# Patient Record
Sex: Male | Born: 1955 | Race: White | Hispanic: No | Marital: Married | Smoking: Former smoker
Health system: Southern US, Community
[De-identification: ages and names within clinical notes are randomized; demographics above are authoritative.]

## PROBLEM LIST (undated history)

## (undated) DIAGNOSIS — I10 Essential (primary) hypertension: Secondary | ICD-10-CM

## (undated) DIAGNOSIS — G4730 Sleep apnea, unspecified: Secondary | ICD-10-CM

## (undated) DIAGNOSIS — E119 Type 2 diabetes mellitus without complications: Secondary | ICD-10-CM

## (undated) DIAGNOSIS — E785 Hyperlipidemia, unspecified: Secondary | ICD-10-CM

---

## 2012-11-16 ENCOUNTER — Emergency Department
Admission: EM | Admit: 2012-11-16 | Discharge: 2012-11-16 | Disposition: A | Discharge status: Home / Self Care | Payer: BC Managed Care – PPO | Source: Home / Self Care

## 2012-11-16 DIAGNOSIS — G4730 Sleep apnea, unspecified: Secondary | ICD-10-CM | POA: Insufficient documentation

## 2012-11-16 DIAGNOSIS — I10 Essential (primary) hypertension: Secondary | ICD-10-CM | POA: Insufficient documentation

## 2012-11-16 DIAGNOSIS — E785 Hyperlipidemia, unspecified: Secondary | ICD-10-CM | POA: Insufficient documentation

## 2012-11-16 DIAGNOSIS — E119 Type 2 diabetes mellitus without complications: Secondary | ICD-10-CM | POA: Insufficient documentation

## 2012-11-16 DIAGNOSIS — J069 Acute upper respiratory infection, unspecified: Secondary | ICD-10-CM

## 2012-11-16 HISTORY — DX: Type 2 diabetes mellitus without complications: E11.9

## 2012-11-16 HISTORY — DX: Sleep apnea, unspecified: G47.30

## 2012-11-16 HISTORY — DX: Essential (primary) hypertension: I10

## 2012-11-16 HISTORY — DX: Hyperlipidemia, unspecified: E78.5

## 2012-11-16 MED ORDER — PREDNISONE 20 MG PO TABS: 20.0000 mg | ORAL_TABLET | Freq: Two times a day (BID) | ORAL | Status: DC

## 2012-11-16 MED ORDER — BENZONATATE 100 MG PO CAPS: ORAL_CAPSULE | ORAL | Status: DC

## 2012-11-16 MED ORDER — AZITHROMYCIN 250 MG PO TABS: ORAL_TABLET | ORAL | Status: DC

## 2012-11-16 NOTE — ED Notes (Signed)
Patient complains of a dry cough for 2 days. He also reports chills, sweats, congestion, shortness of breath and chest pain.

## 2012-11-16 NOTE — ED Provider Notes (Signed)
History    CSN: [id number] Arrival date & time 11/16/12  1414  None    Chief Complaint  Patient presents with  . Cough    x 2 days  . Generalized Body Aches    x 2 days      HPI Comments: Patient complains of a dry cough for 2 days. He also reports chills, sweats, congestion, shortness of breath and chest pain.  The history is provided by the patient and the spouse.   Past Medical History  Diagnosis Date  . Hyperlipidemia   . Hypertension   . Diabetes mellitus without complication   . Sleep apnea    History reviewed. No pertinent past surgical history. Family History  Problem Relation Age of Onset  . Hypertension Mother   . Hypertension Father    History  Substance Use Topics  . Smoking status: Former Smoker -- 1.00 packs/day for 25 years    Types: Cigarettes  . Smokeless tobacco: Not on file  . Alcohol Use: No    Review of Systems + sore throat + cough No pleuritic pain No wheezing + nasal congestion + post-nasal drainage + sinus pain/pressure No itchy/red eyes No earache No hemoptysis + SOB No fever, + chills No nausea No vomiting No abdominal pain No diarrhea No urinary symptoms No skin rashes + fatigue + myalgias No headache Used OTC meds without relief  Allergies  Erythromycin and Sulfa antibiotics  Home Medications   Current Outpatient Rx  Name  Route  Sig  Dispense  Refill  . atenolol (TENORMIN) 25 MG tablet   Oral   Take 50 mg by mouth daily.         Marland Kitchen atorvastatin (LIPITOR) 40 MG tablet   Oral   Take 40 mg by mouth daily.         . cetirizine (ZYRTEC) 10 MG tablet   Oral   Take 10 mg by mouth daily.         . insulin glargine (LANTUS) 100 UNIT/ML injection   Subcutaneous   Inject 120 Units into the skin at bedtime.         . metFORMIN (GLUCOPHAGE) 1000 MG tablet   Oral   Take 1,000 mg by mouth 2 (two) times daily with a meal.         . omeprazole (PRILOSEC) 20 MG capsule   Oral   Take 20 mg by mouth  daily.         . ranitidine (ZANTAC) 150 MG tablet   Oral   Take 150 mg by mouth 2 (two) times daily.         Marland Kitchen azithromycin (ZITHROMAX Z-PAK) 250 MG tablet      Take 2 tabs today; then begin one tab once daily for 4 more days. (Rx void after 11/24/12)   6 each   0   . benzonatate (TESSALON) 100 MG capsule      Take one cap at bedtime as necessary for cough   12 capsule   0   . predniSONE (DELTASONE) 20 MG tablet   Oral   Take 1 tablet (20 mg total) by mouth 2 (two) times daily.   10 tablet   0    BP 156/90  Pulse 89  Temp(Src) 98.2 F (36.8 C) (Oral)  Ht 5\' 11"  (1.803 m)  Wt 247 lb (112.038 kg)  BMI 34.46 kg/m2  SpO2 96% Physical Exam Nursing notes and Vital Signs reviewed. Appearance:  Patient appears stated age, and  in no acute distress.  Patient is obese (BMI 34.5) Eyes:  Pupils are equal, round, and reactive to light and accomodation.  Extraocular movement is intact.  Conjunctivae are not inflamed  Ears:  Canals normal.  Tympanic membranes normal.  Nose:  Mildly congested turbinates.  No sinus tenderness.   Pharynx:  Normal Neck:  Supple.  Tender shotty posterior nodes are palpated bilaterally  Lungs:  Clear to auscultation.  Breath sounds are equal.  Chest:  Distinct tenderness to palpation over the mid-sternum.  Heart:  Regular rate and rhythm without murmurs, rubs, or gallops.  Abdomen:  Nontender without masses or hepatosplenomegaly.  Bowel sounds are present.  No CVA or flank tenderness.  Extremities:  No edema.  No calf tenderness Skin:  No rash present.   ED Course  Procedures  none   1. Acute upper respiratory infections of unspecified site     MDM  There is no evidence of bacterial infection today.   Begin prednisone burst.  Monitor glucose.  Prescription written for Benzonatate [facility]) to take at bedtime for night-time cough.  Take plain Mucinex (guaifenesin) twice daily for cough and congestion.  Increase fluid intake, rest. May use  Afrin nasal spray (or generic oxymetazoline) twice daily for about 5 days.  Also recommend using saline nasal spray several times daily and saline nasal irrigation (AYR is a common brand).  May continue Flonase nasal spray Stop all antihistamines for now, and other non-prescription cough/cold preparations. May take Ibuprofen 200mg , 4 tabs every 8 hours with food for chest/sternum discomfort. Begin Azithromycin if not improving about one week or if persistent fever develops (Given a prescription to hold, with an expiration date)  Follow-up with family doctor if not improving about10 days.   Lattie Haw, MD 11/16/12 [phone]

## 2013-05-10 ENCOUNTER — Emergency Department
Admission: EM | Admit: 2013-05-10 | Discharge: 2013-05-10 | Disposition: A | Discharge status: Home / Self Care | Payer: BC Managed Care – PPO | Source: Home / Self Care | Attending: Emergency Medicine | Admitting: Emergency Medicine

## 2013-05-10 ENCOUNTER — Encounter: Payer: Self-pay | Admitting: Emergency Medicine

## 2013-05-10 DIAGNOSIS — Z23 Encounter for immunization: Secondary | ICD-10-CM

## 2013-05-10 DIAGNOSIS — IMO0002 Reserved for concepts with insufficient information to code with codable children: Secondary | ICD-10-CM

## 2013-05-10 DIAGNOSIS — S0093XA Contusion of unspecified part of head, initial encounter: Secondary | ICD-10-CM

## 2013-05-10 DIAGNOSIS — S0091XA Abrasion of unspecified part of head, initial encounter: Secondary | ICD-10-CM

## 2013-05-10 DIAGNOSIS — S1093XA Contusion of unspecified part of neck, initial encounter: Secondary | ICD-10-CM

## 2013-05-10 DIAGNOSIS — S0083XA Contusion of other part of head, initial encounter: Secondary | ICD-10-CM

## 2013-05-10 DIAGNOSIS — S0003XA Contusion of scalp, initial encounter: Secondary | ICD-10-CM

## 2013-05-10 MED ORDER — TETANUS-DIPHTH-ACELL PERTUSSIS 5-2.5-18.5 LF-MCG/0.5 IM SUSP
0.5000 mL | Freq: Once | INTRAMUSCULAR | Status: AC
  Administered 2013-05-10: 0.5 mL via INTRAMUSCULAR

## 2013-05-10 MED ORDER — CEPHALEXIN 500 MG PO CAPS: 500.0000 mg | ORAL_CAPSULE | Freq: Three times a day (TID) | ORAL | Status: DC

## 2013-05-10 NOTE — ED Notes (Signed)
Abrasion on top right scalp, hit with the end of a drop cord.

## 2013-05-10 NOTE — ED Provider Notes (Signed)
CSN: [id number]     Arrival date & time 05/10/13  1001 History   First MD Initiated Contact with Patient 05/10/13 1008     Chief Complaint  Patient presents with  . Abrasion   (Consider location/radiation/quality/duration/timing/severity/associated sxs/prior Treatment) Patient is a 58 y.o. male presenting with head injury. The history is provided by the patient.  Head Injury Location:  Frontal (Right frontal) Time since incident:  1 hour Mechanism of injury: direct blow   Mechanism of injury comment:  Accidentally hit the end of the drop cord Pain details:    Quality:  Dull   Radiates to: No radiation.   Severity:  Mild   Timing:  Unable to specify   Progression:  Unchanged Chronicity:  New Relieved by:  None tried Associated symptoms: no blurred vision, no difficulty breathing, no disorientation, no double vision, no focal weakness, no headaches, no hearing loss, no loss of consciousness, no memory loss, no nausea, no neck pain, no numbness, no seizures, no tinnitus and no vomiting   Risk factors comment:  Past medical history of diabetes, controlled. Denies hypoglycemia symptoms   Past Medical History  Diagnosis Date  . Hyperlipidemia   . Hypertension   . Diabetes mellitus without complication   . Sleep apnea    History reviewed. No pertinent past surgical history. Family History  Problem Relation Age of Onset  . Hypertension Mother   . Hypertension Father    History  Substance Use Topics  . Smoking status: Former Smoker -- 1.00 packs/day for 25 years    Types: Cigarettes  . Smokeless tobacco: Not on file  . Alcohol Use: No    Review of Systems  HENT: Negative for hearing loss and tinnitus.   Eyes: Negative for blurred vision and double vision.  Gastrointestinal: Negative for nausea and vomiting.  Musculoskeletal: Negative for neck pain.  Neurological: Negative for focal weakness, seizures, loss of consciousness, numbness and headaches.  Psychiatric/Behavioral:  Negative for memory loss.  All other systems reviewed and are negative.    Allergies  Erythromycin and Sulfa antibiotics  Home Medications   Current Outpatient Rx  Name  Route  Sig  Dispense  Refill  . atenolol (TENORMIN) 25 MG tablet   Oral   Take 50 mg by mouth daily.         Marland Kitchen. atorvastatin (LIPITOR) 40 MG tablet   Oral   Take 40 mg by mouth daily.         Marland Kitchen. azithromycin (ZITHROMAX Z-PAK) 250 MG tablet      Take 2 tabs today; then begin one tab once daily for 4 more days. (Rx void after 11/24/12)   6 each   0   . benzonatate (TESSALON) 100 MG capsule      Take one cap at bedtime as necessary for cough   12 capsule   0   . cephALEXin (KEFLEX) 500 MG capsule   Oral   Take 1 capsule (500 mg total) by mouth 3 (three) times daily. For 5 days (antibiotic)   15 capsule   0   . cetirizine (ZYRTEC) 10 MG tablet   Oral   Take 10 mg by mouth daily.         . insulin glargine (LANTUS) 100 UNIT/ML injection   Subcutaneous   Inject 120 Units into the skin at bedtime.         . metFORMIN (GLUCOPHAGE) 1000 MG tablet   Oral   Take 1,000 mg by mouth 2 (two) times  daily with a meal.         . omeprazole (PRILOSEC) 20 MG capsule   Oral   Take 20 mg by mouth daily.         . predniSONE (DELTASONE) 20 MG tablet   Oral   Take 1 tablet (20 mg total) by mouth 2 (two) times daily.   10 tablet   0   . ranitidine (ZANTAC) 150 MG tablet   Oral   Take 150 mg by mouth 2 (two) times daily.          BP 151/91  Pulse 76  Temp(Src) 97.9 F (36.6 C) (Oral)  Ht 5\' 11"  (1.803 m)  Wt 242 lb (109.77 kg)  BMI 33.77 kg/m2  SpO2 97% Physical Exam  Nursing note and vitals reviewed. Constitutional: He is oriented to person, place, and time. He appears well-developed and well-nourished. No distress.  HENT:  Head: Normocephalic. Head is without raccoon's eyes and without Battle's sign.  There is a abrasion/superficial laceration, x shaped , right frontal scalp. No  foreign body seen. No active bleeding. Minimally tender. No cranial deformity  Eyes: Conjunctivae and EOM are normal. Pupils are equal, round, and reactive to light. No scleral icterus.  Neck: Normal range of motion.  Cardiovascular: Normal rate and regular rhythm.   Pulmonary/Chest: Effort normal.  Abdominal: He exhibits no distension.  Musculoskeletal: Normal range of motion. He exhibits no edema and no tenderness.  Neurological: He is alert and oriented to person, place, and time. No cranial nerve deficit.  Skin: Skin is warm. No rash noted.  Psychiatric: He has a normal mood and affect.    ED Course  Procedures (including critical care time) Labs Review Labs Reviewed - No data to display Imaging Review No results found.  EKG Interpretation    Date/Time:    Ventricular Rate:    PR Interval:    QRS Duration:   QT Interval:    QTC Calculation:   R Axis:     Text Interpretation:              MDM   1. Abrasion of head, initial encounter   2. Head contusion, initial encounter    Cleansed with Hibiclens. No foreign body seen. The abrasion/laceration is very superficial.--In my opinion, there is no need for any repair , as the edges are well approximated and very superficial. Polysporin and nonstick dressing applied.--Wound care discussed. Red flags discussed As last tetanus shot was over 7 years ago, updated DTaP today. Cephalexin prescribed to help prevent wound infection , given the risk factors that it was a dirty wound and has diabetes. Precautions discussed. Red flags discussed. Questions invited and answered. Patient voiced understanding and agreement.     Lajean Manes, MD 05/10/13 [phone]

## 2013-07-24 ENCOUNTER — Encounter: Payer: Self-pay | Admitting: Emergency Medicine

## 2013-07-24 ENCOUNTER — Emergency Department (INDEPENDENT_AMBULATORY_CARE_PROVIDER_SITE_OTHER)
Admission: EM | Admit: 2013-07-24 | Discharge: 2013-07-24 | Disposition: A | Discharge status: Home / Self Care | Payer: BC Managed Care – PPO | Source: Home / Self Care | Attending: Family Medicine | Admitting: Family Medicine

## 2013-07-24 DIAGNOSIS — Z8709 Personal history of other diseases of the respiratory system: Secondary | ICD-10-CM

## 2013-07-24 DIAGNOSIS — J069 Acute upper respiratory infection, unspecified: Secondary | ICD-10-CM

## 2013-07-24 MED ORDER — BENZONATATE 200 MG PO CAPS: 200.0000 mg | ORAL_CAPSULE | Freq: Every day | ORAL | Status: DC

## 2013-07-24 MED ORDER — PREDNISONE 20 MG PO TABS: 20.0000 mg | ORAL_TABLET | Freq: Two times a day (BID) | ORAL | Status: DC

## 2013-07-24 MED ORDER — AMOXICILLIN 875 MG PO TABS: 875.0000 mg | ORAL_TABLET | Freq: Two times a day (BID) | ORAL | Status: DC

## 2013-07-24 NOTE — ED Notes (Signed)
Burch c/o dry cough and nasal congestion x 2-3 days without fever. Denies fever. Rec'd flu vac this season.

## 2013-07-24 NOTE — Discharge Instructions (Signed)
Take plain Mucinex (1200 mg guaifenesin) twice daily for cough and congestion.  Increase fluid intake, rest. May use Afrin nasal spray (or generic oxymetazoline) twice daily for about 5 days.  Also recommend using saline nasal spray several times daily and saline nasal irrigation (AYR is a common brand) Stop all antihistamines for now, and other non-prescription cough/cold preparations. Begin Amoxicillin if not improving about one week or if persistent fever develops   Follow-up with family doctor if not improving about10 days.

## 2013-07-24 NOTE — ED Provider Notes (Signed)
CSN: [id number]     Arrival date & time 07/24/13  0806 History   First MD Initiated Contact with Patient 07/24/13 [phone]     Chief Complaint  Patient presents with  . Cough  . Nasal Congestion      HPI Comments: Two days ago patient developed onset of fatigue, myalgias, non-productive cough, and sinus congestion.  The next day he developed a mild sore throat, now resolved.  His cough is worse at night. He has a history of seasonal allergies and mild asthma.  The history is provided by the patient.    Past Medical History  Diagnosis Date  . Hyperlipidemia   . Hypertension   . Diabetes mellitus without complication   . Sleep apnea    History reviewed. No pertinent past surgical history. Family History  Problem Relation Age of Onset  . Hypertension Mother   . Hypertension Father    History  Substance Use Topics  . Smoking status: Former Smoker -- 1.00 packs/day for 25 years    Types: Cigarettes    Quit date: 07/25/1990  . Smokeless tobacco: Never Used  . Alcohol Use: No    Review of Systems + sore throat + cough No pleuritic pain No wheezing + nasal congestion + post-nasal drainage No sinus pain/pressure No itchy/red eyes No earache No hemoptysis No SOB No fever/chills No nausea No vomiting No abdominal pain No diarrhea No urinary symptoms No skin rash + fatigue + myalgias No headache Used OTC meds without relief  Allergies  Erythromycin and Sulfa antibiotics  Home Medications   Current Outpatient Rx  Name  Route  Sig  Dispense  Refill  . amoxicillin (AMOXIL) 875 MG tablet   Oral   Take 1 tablet (875 mg total) by mouth 2 (two) times daily. (Rx void after 08/01/13)   20 tablet   0   . atenolol (TENORMIN) 25 MG tablet   Oral   Take 50 mg by mouth daily.         Marland Kitchen. atorvastatin (LIPITOR) 40 MG tablet   Oral   Take 40 mg by mouth daily.         . benzonatate (TESSALON) 200 MG capsule   Oral   Take 1 capsule (200 mg total) by mouth at bedtime.  Take as needed for cough   12 capsule   0   . cetirizine (ZYRTEC) 10 MG tablet   Oral   Take 10 mg by mouth daily.         . insulin glargine (LANTUS) 100 UNIT/ML injection   Subcutaneous   Inject 120 Units into the skin at bedtime.         . metFORMIN (GLUCOPHAGE) 1000 MG tablet   Oral   Take 1,000 mg by mouth 2 (two) times daily with a meal.         . omeprazole (PRILOSEC) 20 MG capsule   Oral   Take 20 mg by mouth daily.         . predniSONE (DELTASONE) 20 MG tablet   Oral   Take 1 tablet (20 mg total) by mouth 2 (two) times daily. Take with food.   10 tablet   0   . ranitidine (ZANTAC) 150 MG tablet   Oral   Take 150 mg by mouth 2 (two) times daily.          BP 118/77  Pulse 97  Temp(Src) 98.4 F (36.9 C) (Oral)  Resp 14  Wt 231 lb (104.781 kg)  SpO2 97% Physical Exam Nursing notes and Vital Signs reviewed. Appearance:  Patient appears healthy, stated age, and in no acute distress Eyes:  Pupils are equal, round, and reactive to light and accomodation.  Extraocular movement is intact.  Conjunctivae are not inflamed  Ears:  Canals normal.  Tympanic membranes normal.  Nose:  Mildly congested turbinates.  No sinus tenderness.  Pharynx:  Normal Neck:  Supple.  Tender enlarged posterior nodes are palpated bilaterally  Lungs:  Clear to auscultation.  Breath sounds are equal.  Heart:  Regular rate and rhythm without murmurs, rubs, or gallops.  Abdomen:  Nontender without masses or hepatosplenomegaly.  Bowel sounds are present.  No CVA or flank tenderness.  Extremities:  No edema.  No calf tenderness Skin:  No rash present.    ED Course  Procedures  none      MDM   1. Acute upper respiratory infections of unspecified site; suspect viral URI History of asthma    There is no evidence of bacterial infection today.   Begin prednisone burst.  Prescription written for Benzonatate (Tessalon) to take at bedtime for night-time cough.  Take plain Mucinex  (1200 mg guaifenesin) twice daily for cough and congestion.  Increase fluid intake, rest. May use Afrin nasal spray (or generic oxymetazoline) twice daily for about 5 days.  Also recommend using saline nasal spray several times daily and saline nasal irrigation (AYR is a common brand) Stop all antihistamines for now, and other non-prescription cough/cold preparations. Begin Amoxicillin if not improving about one week or if persistent fever develops (Given a prescription to hold, with an expiration date)  Follow-up with family doctor if not improving about10 days.     Lattie Haw, MD 07/24/13 [phone]

## 2014-02-01 ENCOUNTER — Encounter: Payer: Self-pay | Admitting: Emergency Medicine

## 2014-02-01 ENCOUNTER — Emergency Department (INDEPENDENT_AMBULATORY_CARE_PROVIDER_SITE_OTHER)
Admission: EM | Admit: 2014-02-01 | Discharge: 2014-02-01 | Disposition: A | Discharge status: Home / Self Care | Payer: BC Managed Care – PPO | Source: Home / Self Care | Attending: Family Medicine | Admitting: Family Medicine

## 2014-02-01 DIAGNOSIS — T148XXA Other injury of unspecified body region, initial encounter: Secondary | ICD-10-CM

## 2014-02-01 DIAGNOSIS — W540XXA Bitten by dog, initial encounter: Secondary | ICD-10-CM

## 2014-02-01 MED ORDER — AMOXICILLIN-POT CLAVULANATE 875-125 MG PO TABS: 1.0000 | ORAL_TABLET | Freq: Two times a day (BID) | ORAL | Status: DC

## 2014-02-01 NOTE — ED Notes (Signed)
Three puncture wounds on lower left arm from dog bite.  Pts own dog which is UTD on vaccinations.  Cleaned area with H2O2 and applied neosporin.  Going OOT for a week, leaving on Mon.   TT within last 5 years.

## 2014-02-01 NOTE — Discharge Instructions (Signed)
Thank you for coming in today. Keep the wounds clean and covered with antibiotic ointment. Take Augmentin twice daily for one week Go to an urgent care or emergency room if your wound gets worse. ]  Animal Bite An animal bite can result in a scratch on the skin, deep open cut, puncture of the skin, crush injury, or tearing away of the skin or a body part. Dogs are responsible for most animal bites. Children are bitten more often than adults. An animal bite can range from very mild to more serious. A small bite from your house pet is no cause for alarm. However, some animal bites can become infected or injure a bone or other tissue. You must seek medical care if:  The skin is broken and bleeding does not slow down or stop after 15 minutes.  The puncture is deep and difficult to clean (such as a cat bite).  Pain, warmth, redness, or pus develops around the wound.  The bite is from a stray animal or rodent. There may be a risk of rabies infection.  The bite is from a snake, raccoon, skunk, fox, coyote, or bat. There may be a risk of rabies infection.  The person bitten has a chronic illness such as diabetes, liver disease, or cancer, or the person takes medicine that lowers the immune system.  There is concern about the location and severity of the bite. It is important to clean and protect an animal bite wound right away to prevent infection. Follow these steps:  Clean the wound with plenty of water and soap.  Apply an antibiotic cream.  Apply gentle pressure over the wound with a clean towel or gauze to slow or stop bleeding.  Elevate the affected area above the heart to help stop any bleeding.  Seek medical care. Getting medical care within 8 hours of the animal bite leads to the best possible outcome. DIAGNOSIS  Your caregiver will most likely:  Take a detailed history of the animal and the bite injury.  Perform a wound exam.  Take your medical history. Blood tests or X-rays  may be performed. Sometimes, infected bite wounds are cultured and sent to a lab to identify the infectious bacteria.  TREATMENT  Medical treatment will depend on the location and type of animal bite as well as the patient's medical history. Treatment may include:  Wound care, such as cleaning and flushing the wound with saline solution, bandaging, and elevating the affected area.  Antibiotics.  Tetanus immunization.  Rabies immunization.  Leaving the wound open to heal. This is often done with animal bites, due to the high risk of infection. However, in certain cases, wound closure with stitches, wound adhesive, skin adhesive strips, or staples may be used. Infected bites that are left untreated may require intravenous (IV) antibiotics and surgical treatment in the hospital. HOME CARE INSTRUCTIONS  Follow your caregiver's instructions for wound care.  Take all medicines as directed.  If your caregiver prescribes antibiotics, take them as directed. Finish them even if you start to feel better.  Follow up with your caregiver for further exams or immunizations as directed. You may need a tetanus shot if:  You cannot remember when you had your last tetanus shot.  You have never had a tetanus shot.  The injury broke your skin. If you get a tetanus shot, your arm may swell, get red, and feel warm to the touch. This is common and not a problem. If you need a tetanus shot  and you choose not to have one, there is a rare chance of getting tetanus. Sickness from tetanus can be serious. SEEK MEDICAL CARE IF:  You notice warmth, redness, soreness, swelling, pus discharge, or a bad smell coming from the wound.  You have a red line on the skin coming from the wound.  You have a fever, chills, or a general ill feeling.  You have nausea or vomiting.  You have continued or worsening pain.  You have trouble moving the injured part.  You have other questions or concerns. MAKE SURE  YOU:  Understand these instructions.  Will watch your condition.  Will get help right away if you are not doing well or get worse. Document Released: 01/11/2011 Document Revised: 07/18/2011 Document Reviewed: 01/11/2011 [facility] Patient Information 2015 [place], [place]. This information is not intended to replace advice given to you by your health care provider. Make sure you discuss any questions you have with your health care provider.

## 2014-02-01 NOTE — ED Provider Notes (Signed)
Antonio Burns is a 58 y.o. male who presents to Urgent Care today for Left arm dog bite. Patient was bitten by his dog on his left arm yesterday evening. He was attempting to break up a dog fight. He suffers 3 small puncture wounds on his left forearm. He denies any fevers or chills radiating pain weakness or numbness. He has not tried any medications yet. He applied some antibiotic ointment to the wound after cleaning it. His dogs are up-to-date rabies vaccination and he had a tetanus shot within the last 5 years.   Past Medical History  Diagnosis Date  . Hyperlipidemia   . Hypertension   . Diabetes mellitus without complication   . Sleep apnea    History  Substance Use Topics  . Smoking status: Former Smoker -- 1.00 packs/day for 25 years    Types: Cigarettes    Quit date: 07/25/1990  . Smokeless tobacco: Never Used  . Alcohol Use: No   ROS as above Medications: No current facility-administered medications for this encounter.   Current Outpatient Prescriptions  Medication Sig Dispense Refill  . amoxicillin (AMOXIL) 875 MG tablet Take 1 tablet (875 mg total) by mouth 2 (two) times daily. (Rx void after 08/01/13)  20 tablet  0  . amoxicillin-clavulanate (AUGMENTIN) 875-125 MG per tablet Take 1 tablet by mouth every 12 (twelve) hours.  14 tablet  0  . atenolol (TENORMIN) 25 MG tablet Take 50 mg by mouth daily.      Marland Kitchen atorvastatin (LIPITOR) 40 MG tablet Take 40 mg by mouth daily.      . benzonatate (TESSALON) 200 MG capsule Take 1 capsule (200 mg total) by mouth at bedtime. Take as needed for cough  12 capsule  0  . cetirizine (ZYRTEC) 10 MG tablet Take 10 mg by mouth daily.      . insulin glargine (LANTUS) 100 UNIT/ML injection Inject 100 Units into the skin 2 (two) times daily.       . metFORMIN (GLUCOPHAGE) 1000 MG tablet Take 1,000 mg by mouth 2 (two) times daily with a meal.      . omeprazole (PRILOSEC) 20 MG capsule Take 20 mg by mouth daily.      . predniSONE (DELTASONE) 20  MG tablet Take 1 tablet (20 mg total) by mouth 2 (two) times daily. Take with food.  10 tablet  0  . ranitidine (ZANTAC) 150 MG tablet Take 150 mg by mouth 2 (two) times daily.        Exam:  BP 129/82  Pulse 68  Temp(Src) 97.9 F (36.6 C) (Oral)  Ht  (1.803 m)  Wt 242 lb 12 oz (110.111 kg)  BMI 33.87 kg/m2  SpO2 98% Gen: Well NAD Left forearm: 3 small puncture wounds left forearm. The one on the dorsal radial aspect extends through the dermis but does not involve any deep structures. The wounds were cleaned with Hibiclens solution and irrigated in the office.  Normal hand motion pulses capillary refill and sensation distally.  No results found for this or any previous visit (from the past 24 hour(s)). No results found.  Assessment and Plan: 58 y.o. male with dog bite. Rabies and tetanus up-to-date. Prophylactic Augmentin antibiotics. Routine management. Followup as needed.  Discussed warning signs or symptoms. Please see discharge instructions. Patient expresses understanding.     Rodolph Bong, MD 02/01/14 [phone]

## 2014-03-19 ENCOUNTER — Ambulatory Visit (INDEPENDENT_AMBULATORY_CARE_PROVIDER_SITE_OTHER): Payer: BC Managed Care – PPO | Admitting: Sports Medicine

## 2014-03-19 ENCOUNTER — Encounter: Payer: Self-pay | Admitting: Sports Medicine

## 2014-03-19 VITALS — BP 123/75 | HR 69 | Ht 71.0 in | Wt 244.0 lb

## 2014-03-19 DIAGNOSIS — H6120 Impacted cerumen, unspecified ear: Secondary | ICD-10-CM | POA: Insufficient documentation

## 2014-03-19 DIAGNOSIS — E785 Hyperlipidemia, unspecified: Secondary | ICD-10-CM

## 2014-03-19 DIAGNOSIS — Z23 Encounter for immunization: Secondary | ICD-10-CM | POA: Diagnosis not present

## 2014-03-19 DIAGNOSIS — L821 Other seborrheic keratosis: Secondary | ICD-10-CM | POA: Diagnosis not present

## 2014-03-19 DIAGNOSIS — N529 Male erectile dysfunction, unspecified: Secondary | ICD-10-CM | POA: Insufficient documentation

## 2014-03-19 DIAGNOSIS — H6121 Impacted cerumen, right ear: Secondary | ICD-10-CM | POA: Diagnosis not present

## 2014-03-19 DIAGNOSIS — N5201 Erectile dysfunction due to arterial insufficiency: Secondary | ICD-10-CM | POA: Diagnosis not present

## 2014-03-19 DIAGNOSIS — Z0000 Encounter for general adult medical examination without abnormal findings: Secondary | ICD-10-CM | POA: Diagnosis not present

## 2014-03-19 DIAGNOSIS — E119 Type 2 diabetes mellitus without complications: Secondary | ICD-10-CM

## 2014-03-19 DIAGNOSIS — I10 Essential (primary) hypertension: Secondary | ICD-10-CM

## 2014-03-19 LAB — COMPREHENSIVE METABOLIC PANEL WITH GFR
ALT: 55 U/L — ABNORMAL HIGH (ref 0–53)
AST: 51 U/L — ABNORMAL HIGH (ref 0–37)
Creat: 1.59 mg/dL — ABNORMAL HIGH (ref 0.50–1.35)
Sodium: 138 meq/L (ref 135–145)
Total Bilirubin: 1.1 mg/dL (ref 0.2–1.2)

## 2014-03-19 LAB — LIPID PANEL
Cholesterol: 139 mg/dL (ref 0–200)
HDL: 19 mg/dL — ABNORMAL LOW (ref >39)
Total CHOL/HDL Ratio: 7.3 ratio
Triglycerides: 486 mg/dL — ABNORMAL HIGH (ref <150)

## 2014-03-19 LAB — COMPREHENSIVE METABOLIC PANEL
Albumin: 4.4 g/dL (ref 3.5–5.2)
Alkaline Phosphatase: 49 U/L (ref 39–117)
BUN: 26 mg/dL — ABNORMAL HIGH (ref 6–23)
CO2: 26 mEq/L (ref 19–32)
Calcium: 9.8 mg/dL (ref 8.4–10.5)
Chloride: 95 mEq/L — ABNORMAL LOW (ref 96–112)
Glucose, Bld: 131 mg/dL — ABNORMAL HIGH (ref 70–99)
Potassium: 3 mEq/L — ABNORMAL LOW (ref 3.5–5.3)
Total Protein: 7.2 g/dL (ref 6.0–8.3)

## 2014-03-19 LAB — CBC
HCT: 43.6 % (ref 39.0–52.0)
Hemoglobin: 16 g/dL (ref 13.0–17.0)
MCH: 29.6 pg (ref 26.0–34.0)
MCHC: 36.7 g/dL — ABNORMAL HIGH (ref 30.0–36.0)
MCV: 80.6 fL (ref 78.0–100.0)
Platelets: 103 K/uL — ABNORMAL LOW (ref 150–400)
RBC: 5.41 MIL/uL (ref 4.22–5.81)
RDW: 15.2 % (ref 11.5–15.5)
WBC: 6.9 K/uL (ref 4.0–10.5)

## 2014-03-19 LAB — TSH: TSH: 2.429 u[IU]/mL (ref 0.350–4.500)

## 2014-03-19 MED ORDER — ACYCLOVIR 400 MG PO TABS: 400.0000 mg | ORAL_TABLET | Freq: Every day | ORAL | Status: DC

## 2014-03-19 MED ORDER — METFORMIN HCL 1000 MG PO TABS: 1000.0000 mg | ORAL_TABLET | Freq: Two times a day (BID) | ORAL | Status: DC

## 2014-03-19 MED ORDER — AMLODIPINE BESYLATE-VALSARTAN 5-320 MG PO TABS: 1.0000 | ORAL_TABLET | Freq: Every day | ORAL | Status: DC

## 2014-03-19 MED ORDER — OMEPRAZOLE 20 MG PO CPDR
20.0000 mg | DELAYED_RELEASE_CAPSULE | Freq: Every day | ORAL | Status: DC
Start: 2014-03-19 — End: 2015-03-30

## 2014-03-19 MED ORDER — POTASSIUM CHLORIDE CRYS ER 20 MEQ PO TBCR: 20.0000 meq | EXTENDED_RELEASE_TABLET | Freq: Every day | ORAL | Status: DC

## 2014-03-19 MED ORDER — ATORVASTATIN CALCIUM 20 MG PO TABS: 20.0000 mg | ORAL_TABLET | Freq: Every day | ORAL | Status: DC

## 2014-03-19 MED ORDER — SILDENAFIL CITRATE 100 MG PO TABS: 100.0000 mg | ORAL_TABLET | ORAL | Status: DC | PRN

## 2014-03-19 MED ORDER — SILDENAFIL CITRATE 20 MG PO TABS: 20.0000 mg | ORAL_TABLET | ORAL | Status: DC | PRN

## 2014-03-19 MED ORDER — CETIRIZINE HCL 10 MG PO TABS: 10.0000 mg | ORAL_TABLET | Freq: Every day | ORAL | Status: DC

## 2014-03-19 MED ORDER — EMPAGLIFLOZIN-LINAGLIPTIN 25-5 MG PO TABS: 1.0000 | ORAL_TABLET | Freq: Every day | ORAL | Status: DC

## 2014-03-19 MED ORDER — FUROSEMIDE 40 MG PO TABS: 40.0000 mg | ORAL_TABLET | Freq: Every day | ORAL | Status: DC

## 2014-03-19 NOTE — Addendum Note (Signed)
Addended by: Monica BectonHEKKEKANDAM, Corde Antonini J on: 03/19/2014 02:21 PM   Modules accepted: Orders, Medications

## 2014-03-19 NOTE — Assessment & Plan Note (Signed)
Continue current medication. No changes.

## 2014-03-19 NOTE — Assessment & Plan Note (Signed)
Cryotherapy as above. 

## 2014-03-19 NOTE — Assessment & Plan Note (Signed)
Removal with irrigation and physician curettage.

## 2014-03-19 NOTE — Assessment & Plan Note (Signed)
Currently on high dose of Lantus, 100 units twice a day. He has never been on orals. He tells me his initial hemoglobin A1c was not very high, in the sixes. He is amenable to trying oral medication, continue metformin. Adding Glyzambi. Afterwards we can add Victoza, and if still insufficient control, we will go back to glargine insulin however we will use Toujeo considering his high-dose.

## 2014-03-19 NOTE — Assessment & Plan Note (Signed)
Physical exam done today. Tetanus shot 5 years ago, diabetic foot exam January 2015, eye exam January 2015, flu shot today. Colonoscopy 5 years ago.

## 2014-03-19 NOTE — Progress Notes (Signed)
  Subjective:    CC: Establish care.   HPI:  Diabetes mellitus type 2: On metformin and 100 units of Lantus twice a day. Eye exam and foot exam or in January of this year. He tells me his initial hemoglobin A1c, was not very high when he was diagnosed.  He has never been tried on oral medication other than metformin. He would like to come off of insulin.  Hypertension: Stable on current medications  Hypertriglyceridemia: Stable on current medication  Preventive measures: Up-to-date on tetanus vaccination and colonoscopy, needs an influenza vaccine.  Erectile dysfunction: Would like Viagra.  Skin lesion: Chest, wonders what this is, present for years, not changing.  Past medical history, Surgical history, Family history not pertinant except as noted below, Social history, Allergies, and medications have been entered into the medical record, reviewed, and no changes needed.   Review of Systems: No headache, visual changes, nausea, vomiting, diarrhea, constipation, dizziness, abdominal pain, skin rash, fevers, chills, night sweats, swollen lymph nodes, weight loss, chest pain, body aches, joint swelling, muscle aches, shortness of breath, mood changes, visual or auditory hallucinations.  Objective:    General: Well Developed, well nourished, and in no acute distress.  Neuro: Alert and oriented x3, extra-ocular muscles intact, sensation grossly intact.  HEENT: Normocephalic, atraumatic, pupils equal round reactive to light, neck supple, no masses, no lymphadenopathy, thyroid nonpalpable.  Cerumen impaction on the right side. Skin: Warm and dry, 1 centimeter seborrheic keratosis on the anterior chest. Cardiac: Regular rate and rhythm, no murmurs rubs or gallops.  Respiratory: Clear to auscultation bilaterally. Not using accessory muscles, speaking in full sentences.  Abdominal: Soft, nontender, nondistended, positive bowel sounds, no masses, no organomegaly.  Musculoskeletal: Shoulder,  elbow, wrist, hip, knee, ankle stable, and with full range of motion.  Indication: Cerumen impaction of the ear(s) Medical necessity statement: On physical examination, cerumen impairs clinically significant portions of the external auditory canal, and tympanic membrane. Noted obstructive, copious cerumen that cannot be removed without magnification and instrumentations requiring physician skills Consent: Discussed benefits and risks of procedure and verbal consent obtained Procedure: Patient was prepped for the procedure. Utilized an otoscope to assess and take note of the ear canal, the tympanic membrane, and the presence, amount, and placement of the cerumen. Gentle water irrigation and soft plastic curette was utilized to remove cerumen.  Post procedure examination: shows cerumen was completely removed. Patient tolerated procedure well. The patient is made aware that they may experience temporary vertigo, temporary hearing loss, and temporary discomfort. If these symptom last for more than 24 hours to call the clinic or proceed to the ED.  Procedure:  Cryodestruction of anterior chest seborrheic keratosis Consent obtained and verified. Time-out conducted. Noted no overlying erythema, induration, or other signs of local infection. Completed without difficulty using Cryo-Gun. Advised to call if fevers/chills, erythema, induration, drainage, or persistent bleeding.   Impression and Recommendations:    The patient was counselled, risk factors were discussed, anticipatory guidance given.

## 2014-03-19 NOTE — Assessment & Plan Note (Signed)
Adding Viagra. [facility].

## 2014-03-19 NOTE — Assessment & Plan Note (Signed)
Continue atorvastatin. Rechecking lipids.

## 2014-03-20 ENCOUNTER — Telehealth: Payer: Self-pay | Admitting: *Deleted

## 2014-03-20 ENCOUNTER — Encounter: Payer: Self-pay | Admitting: Sports Medicine

## 2014-03-20 LAB — HEMOGLOBIN A1C
Hgb A1c MFr Bld: 7.2 % — ABNORMAL HIGH (ref <5.7)
Mean Plasma Glucose: 160 mg/dL — ABNORMAL HIGH (ref <117)

## 2014-03-20 MED ORDER — FENOFIBRATE 145 MG PO TABS: 145.0000 mg | ORAL_TABLET | Freq: Every day | ORAL | Status: DC

## 2014-03-20 NOTE — Telephone Encounter (Signed)
Pt is ok with trying fenofibrate. ( I accidentally closed the results encounter)

## 2014-03-20 NOTE — Telephone Encounter (Signed)
Adding fenofibrate, this will add more potent triglyceride lowering action.

## 2014-03-21 ENCOUNTER — Encounter: Payer: Self-pay | Admitting: Sports Medicine

## 2014-03-23 ENCOUNTER — Encounter: Payer: Self-pay | Admitting: Sports Medicine

## 2014-03-24 ENCOUNTER — Encounter: Payer: Self-pay | Admitting: Sports Medicine

## 2014-03-24 MED ORDER — POTASSIUM CHLORIDE CRYS ER 20 MEQ PO TBCR: 40.0000 meq | EXTENDED_RELEASE_TABLET | Freq: Two times a day (BID) | ORAL | Status: DC

## 2014-03-24 MED ORDER — FENOFIBRATE 145 MG PO TABS: 145.0000 mg | ORAL_TABLET | Freq: Every day | ORAL | Status: DC

## 2014-03-25 MED ORDER — VALACYCLOVIR HCL 1 G PO TABS: 1000.0000 mg | ORAL_TABLET | Freq: Every day | ORAL | Status: DC

## 2014-03-25 NOTE — Addendum Note (Signed)
Addended by: Monica BectonHEKKEKANDAM, THOMAS J on: 03/25/2014 11:13 AM   Modules accepted: Orders, Medications

## 2014-05-12 ENCOUNTER — Ambulatory Visit (INDEPENDENT_AMBULATORY_CARE_PROVIDER_SITE_OTHER): Payer: BLUE CROSS/BLUE SHIELD

## 2014-05-12 ENCOUNTER — Encounter: Payer: Self-pay | Admitting: Sports Medicine

## 2014-05-12 ENCOUNTER — Ambulatory Visit (INDEPENDENT_AMBULATORY_CARE_PROVIDER_SITE_OTHER): Payer: BLUE CROSS/BLUE SHIELD | Admitting: Sports Medicine

## 2014-05-12 VITALS — BP 168/71 | HR 83 | Ht 71.0 in | Wt 238.0 lb

## 2014-05-12 DIAGNOSIS — M5136 Other intervertebral disc degeneration, lumbar region: Secondary | ICD-10-CM

## 2014-05-12 DIAGNOSIS — A6000 Herpesviral infection of urogenital system, unspecified: Secondary | ICD-10-CM | POA: Insufficient documentation

## 2014-05-12 DIAGNOSIS — N139 Obstructive and reflux uropathy, unspecified: Secondary | ICD-10-CM

## 2014-05-12 DIAGNOSIS — K8020 Calculus of gallbladder without cholecystitis without obstruction: Secondary | ICD-10-CM | POA: Diagnosis not present

## 2014-05-12 DIAGNOSIS — M51369 Other intervertebral disc degeneration, lumbar region without mention of lumbar back pain or lower extremity pain: Secondary | ICD-10-CM | POA: Insufficient documentation

## 2014-05-12 DIAGNOSIS — N200 Calculus of kidney: Secondary | ICD-10-CM

## 2014-05-12 MED ORDER — MELOXICAM 15 MG PO TABS: ORAL_TABLET | ORAL | Status: DC

## 2014-05-12 MED ORDER — TAMSULOSIN HCL 0.4 MG PO CAPS: 0.4000 mg | ORAL_CAPSULE | Freq: Every day | ORAL | Status: DC

## 2014-05-12 MED ORDER — PREDNISONE 50 MG PO TABS: ORAL_TABLET | ORAL | Status: DC

## 2014-05-12 MED ORDER — VALACYCLOVIR HCL 1 G PO TABS: 1000.0000 mg | ORAL_TABLET | Freq: Two times a day (BID) | ORAL | Status: DC

## 2014-05-12 NOTE — Progress Notes (Signed)
  Subjective:    CC: low back pain  HPI: This is a very pleasant 59 year old male with a history of lumbar degenerative disc disease. He's had pain on and off, it never lasts long. More recently he went to go from a sitting to a standing position, when rocking himself forward into flexion he felt a pop and immediate pain in the right side of his low back without radiation. He denies any bowel or bladder dysfunction, no saddle numbness, pain is worse when bending forward. He tells me pain is predominantly axial without a radicular component.  Polyuria: Does have history of diabetes and was recently placed on Glyxambi, he has however noted chronic periods of frequency and urgency, and a sensation of incomplete emptying of his bladder. He has never been on alpha blockers for obstructive uropathy.  Denies any fevers, chills, no dysuria or burning.  Genital herpes: Currently on 1000 mg of Valtrex daily, continues to have outbreaks. Current outbreak is starting to clear up.  Past medical history, Surgical history, Family history not pertinant except as noted below, Social history, Allergies, and medications have been entered into the medical record, reviewed, and no changes needed.   Review of Systems: No fevers, chills, night sweats, weight loss, chest pain, or shortness of breath.   Objective:    General: Well Developed, well nourished, and in no acute distress.  Neuro: Alert and oriented x3, extra-ocular muscles intact, sensation grossly intact.  HEENT: Normocephalic, atraumatic, pupils equal round reactive to light, neck supple, no masses, no lymphadenopathy, thyroid nonpalpable.  Skin: Warm and dry, no rashes. Cardiac: Regular rate and rhythm, no murmurs rubs or gallops, no lower extremity edema.  Respiratory: Clear to auscultation bilaterally. Not using accessory muscles, speaking in full sentences. Back Exam:  Inspection: Unremarkable  Motion: Flexion 45 deg, Extension 45 deg, Side Bending  to 45 deg bilaterally,  Rotation to 45 deg bilaterally  SLR laying: Negative  XSLR laying: Negative  Palpable tenderness: None. FABER: negative. Sensory change: Gross sensation intact to all lumbar and sacral dermatomes.  Reflexes: 2+ at both patellar tendons, 2+ at achilles tendons, Babinski's downgoing.  Strength at foot  Plantar-flexion: 5/5 Dorsi-flexion: 5/5 Eversion: 5/5 Inversion: 5/5  Leg strength  Quad: 5/5 Hamstring: 5/5 Hip flexor: 5/5 Hip abductors: 5/5  Gait unremarkable.  Impression and Recommendations:

## 2014-05-12 NOTE — Assessment & Plan Note (Signed)
Prednisone, meloxicam. Home rehabilitation exercises. Return in one and a half weeks, if no better we will obtain an MRI for interventional planning, he does have a trip coming up in 3 weeks.

## 2014-05-12 NOTE — Assessment & Plan Note (Signed)
Starting Flomax. We will discuss this when he returns.

## 2014-05-12 NOTE — Assessment & Plan Note (Signed)
persistent outbreak despite Valtrex 1000 mg daily. Increasing to 1000 mg twice a day. If persistently recurrent symptoms we will switch to famciclovir.

## 2014-05-19 ENCOUNTER — Encounter: Payer: Self-pay | Admitting: Sports Medicine

## 2014-05-19 ENCOUNTER — Ambulatory Visit (INDEPENDENT_AMBULATORY_CARE_PROVIDER_SITE_OTHER): Payer: BLUE CROSS/BLUE SHIELD | Admitting: Sports Medicine

## 2014-05-19 VITALS — BP 151/87 | HR 120 | Ht 71.0 in | Wt 230.0 lb

## 2014-05-19 DIAGNOSIS — M5136 Other intervertebral disc degeneration, lumbar region: Secondary | ICD-10-CM | POA: Diagnosis not present

## 2014-05-19 DIAGNOSIS — A6000 Herpesviral infection of urogenital system, unspecified: Secondary | ICD-10-CM

## 2014-05-19 DIAGNOSIS — N139 Obstructive and reflux uropathy, unspecified: Secondary | ICD-10-CM | POA: Diagnosis not present

## 2014-05-19 DIAGNOSIS — M51369 Other intervertebral disc degeneration, lumbar region without mention of lumbar back pain or lower extremity pain: Secondary | ICD-10-CM

## 2014-05-19 MED ORDER — SCOPOLAMINE 1 MG/3DAYS TD PT72: 1.0000 | MEDICATED_PATCH | TRANSDERMAL | Status: DC

## 2014-05-19 NOTE — Assessment & Plan Note (Signed)
Difficult to tell as he did have frequent urination while on the prednisone. He will give this another couple of weeks before changing dose.

## 2014-05-19 NOTE — Assessment & Plan Note (Signed)
Cleared up after doubling to 2000 mg daily. After the next week he will decreased back to 1000 mg daily.  Certainly we can switch to famciclovir if he has a flare.

## 2014-05-19 NOTE — Assessment & Plan Note (Signed)
Resolved with 5 days of prednisone.

## 2014-05-19 NOTE — Progress Notes (Signed)
  Subjective:    CC: Follow-up  HPI: Low back pain: Resolved with prednisone.  Genital herpes: Resolved with doubling his Valtrex to 1000 twice a day.  Obstructive uropathy: Still having symptoms due to the prednisone, he would like to try a few weeks off of prednisone, continuing Flomax before making a decision about efficacy.  Past medical history, Surgical history, Family history not pertinant except as noted below, Social history, Allergies, and medications have been entered into the medical record, reviewed, and no changes needed.   Review of Systems: No fevers, chills, night sweats, weight loss, chest pain, or shortness of breath.   Objective:    General: Well Developed, well nourished, and in no acute distress.  Neuro: Alert and oriented x3, extra-ocular muscles intact, sensation grossly intact.  HEENT: Normocephalic, atraumatic, pupils equal round reactive to light, neck supple, no masses, no lymphadenopathy, thyroid nonpalpable.  Skin: Warm and dry, no rashes. Cardiac: Regular rate and rhythm, no murmurs rubs or gallops, no lower extremity edema.  Respiratory: Clear to auscultation bilaterally. Not using accessory muscles, speaking in full sentences.  Impression and Recommendations:

## 2014-06-19 ENCOUNTER — Ambulatory Visit: Payer: BC Managed Care – PPO | Admitting: Sports Medicine

## 2014-06-23 ENCOUNTER — Telehealth: Payer: Self-pay

## 2014-06-23 DIAGNOSIS — E119 Type 2 diabetes mellitus without complications: Secondary | ICD-10-CM

## 2014-06-23 MED ORDER — EMPAGLIFLOZIN-LINAGLIPTIN 25-5 MG PO TABS: 1.0000 | ORAL_TABLET | Freq: Every day | ORAL | Status: DC

## 2014-06-23 NOTE — Telephone Encounter (Signed)
[facility] requested a 90 day supply for Glyxambi 25 mg#90 3 R was sent in electronically. Ottilie Wigglesworth,CMA

## 2014-07-31 ENCOUNTER — Other Ambulatory Visit: Payer: Self-pay | Admitting: Sports Medicine

## 2014-08-18 ENCOUNTER — Ambulatory Visit (INDEPENDENT_AMBULATORY_CARE_PROVIDER_SITE_OTHER): Payer: BLUE CROSS/BLUE SHIELD | Admitting: Sports Medicine

## 2014-08-18 ENCOUNTER — Encounter: Payer: Self-pay | Admitting: Sports Medicine

## 2014-08-18 VITALS — BP 137/80 | HR 72 | Ht 71.0 in | Wt 218.0 lb

## 2014-08-18 DIAGNOSIS — I10 Essential (primary) hypertension: Secondary | ICD-10-CM

## 2014-08-18 DIAGNOSIS — N139 Obstructive and reflux uropathy, unspecified: Secondary | ICD-10-CM

## 2014-08-18 DIAGNOSIS — A6000 Herpesviral infection of urogenital system, unspecified: Secondary | ICD-10-CM

## 2014-08-18 DIAGNOSIS — D693 Immune thrombocytopenic purpura: Secondary | ICD-10-CM

## 2014-08-18 DIAGNOSIS — E785 Hyperlipidemia, unspecified: Secondary | ICD-10-CM

## 2014-08-18 DIAGNOSIS — E119 Type 2 diabetes mellitus without complications: Secondary | ICD-10-CM | POA: Diagnosis not present

## 2014-08-18 DIAGNOSIS — N4889 Other specified disorders of penis: Secondary | ICD-10-CM

## 2014-08-18 DIAGNOSIS — N489 Disorder of penis, unspecified: Secondary | ICD-10-CM | POA: Insufficient documentation

## 2014-08-18 MED ORDER — CLOTRIMAZOLE-BETAMETHASONE 1-0.05 % EX CREA: 1.0000 "application " | TOPICAL_CREAM | Freq: Two times a day (BID) | CUTANEOUS | Status: DC

## 2014-08-18 MED ORDER — TAMSULOSIN HCL 0.4 MG PO CAPS: 0.8000 mg | ORAL_CAPSULE | Freq: Every day | ORAL | Status: DC

## 2014-08-18 NOTE — Patient Instructions (Signed)
Lichen Sclerosus Lichen sclerosus is a skin problem. It can happen on any part of the body, but it commonly involves the anal or genital areas. Lichen sclerosus is not an infection or a fungus. Girls and women are more commonly affected than boys and men. CAUSES The cause is not known. It could be the result of an overactive immune system or a lack of certain hormones. Lichen sclerosus is not passed from one person to another (not contagious). SYMPTOMS Your skin may have:  Thin, wrinkled, white areas.  Thickened white areas.  Red and swollen patches.  Tears or cracks.  Bruising.  Blood blisters.  Severe itching. You may also have pain, itching, or burning with urination. Constipation is also common in people with lichen sclerosus. DIAGNOSIS Your caregiver will do a physical exam. Sometimes, a tissue sample (biopsy) may be sent for testing. TREATMENT Treatment may involve putting a thin layer of medicated cream (topical steroid) over the areas with lichen sclerosus. Use the cream only as directed by your caregiver.  HOME CARE INSTRUCTIONS  Only take over-the-counter or prescription medicines as directed by your caregiver.  Keep the vaginal area as clean and dry as possible. SEEK MEDICAL CARE IF: You develop increasing pain, swelling, or redness. Document Released: 09/15/2010 Document Revised: 07/18/2011 Document Reviewed: 09/15/2010 Exit[employer] Patient Information 2015 [employer]. This information is not intended to replace advice given to you by your health care provider. Make sure you discuss any questions you have with your health care provider.  

## 2014-08-18 NOTE — Assessment & Plan Note (Addendum)
Rechecking lipids. Persistently elevated triglycerides, adding Vascepa

## 2014-08-18 NOTE — Assessment & Plan Note (Signed)
Controlled, no changes. 

## 2014-08-18 NOTE — Progress Notes (Signed)
  Subjective:    CC: follow-up  HPI: Diabetes mellitus type 2: Has been on current medications now for 3 months. Initially he was on Lantus 100 units 3 times a day, he had never been on an oral medication.  Hypertension: Well controlled.  Hyperlipidemia: With hypertriglyceridemia, currently on a statin and fibrate.  Genital herpes: Has since decreased to 1000 mg daily without any outbreaks.  Penile rash: Present for a few months, not itchy, but there is some sensitivity during intercourse.  Past medical history, Surgical history, Family history not pertinant except as noted below, Social history, Allergies, and medications have been entered into the medical record, reviewed, and no changes needed.   Review of Systems: No fevers, chills, night sweats, weight loss, chest pain, or shortness of breath.   Objective:    General: Well Developed, well nourished, and in no acute distress.  Neuro: Alert and oriented x3, extra-ocular muscles intact, sensation grossly intact.  HEENT: Normocephalic, atraumatic, pupils equal round reactive to light, neck supple, no masses, no lymphadenopathy, thyroid nonpalpable.  Skin: Warm and dry, we did inspect the penis, there is erythematous, well-defined rash at the junction of the glans and shaft both on the dorsal and the volar surface of the penis, it is minimally erythematous without any scaling, no discharge, only minimally tender, no penile discharge, no testicular or epididymal tenderness. Cardiac: Regular rate and rhythm, no murmurs rubs or gallops, no lower extremity edema.  Respiratory: Clear to auscultation bilaterally. Not using accessory muscles, speaking in full sentences.  Impression and Recommendations:

## 2014-08-18 NOTE — Assessment & Plan Note (Signed)
Decreasing back to 1000 mg daily.

## 2014-08-18 NOTE — Assessment & Plan Note (Signed)
Platelet counts have been stable.

## 2014-08-18 NOTE — Assessment & Plan Note (Addendum)
Continue current medications. Rechecking hemoglobin A1c. He was in a high unit dose of Lantus at 100 units twice a day, if insufficient control we will add Trulicity, if continued insufficient control we can switch to Toujeo  Adding Trulicity, we will do this for 3 months and increase to the max dose if insufficient response.

## 2014-08-18 NOTE — Assessment & Plan Note (Signed)
Persistent nocturia, switching Flomax to the evening, and doubling to 0.8 mg.

## 2014-08-18 NOTE — Assessment & Plan Note (Signed)
Suspect lichen sclerosis. We are going to treat with topical Lotrisone to eliminate any fungal etiology. Return in one month, if insufficient response we will refer to dermatology.

## 2014-08-19 LAB — CBC
HCT: 44.3 % (ref 39.0–52.0)
Hemoglobin: 15.2 g/dL (ref 13.0–17.0)
MCH: 31 pg (ref 26.0–34.0)
MCHC: 34.3 g/dL (ref 30.0–36.0)
MCV: 90.4 fL (ref 78.0–100.0)
MPV: 9.8 fL (ref 8.6–12.4)
Platelets: 100 10*3/uL — ABNORMAL LOW (ref 150–400)
RBC: 4.9 MIL/uL (ref 4.22–5.81)
RDW: 14.9 % (ref 11.5–15.5)
WBC: 4.3 10*3/uL (ref 4.0–10.5)

## 2014-08-19 LAB — LIPID PANEL
Cholesterol: 138 mg/dL (ref 0–200)
HDL: 22 mg/dL — ABNORMAL LOW (ref >=40)
LDL Cholesterol: 44 mg/dL (ref 0–99)
Total CHOL/HDL Ratio: 6.3 ratio
Triglycerides: 362 mg/dL — ABNORMAL HIGH (ref <150)
VLDL: 72 mg/dL — ABNORMAL HIGH (ref 0–40)

## 2014-08-19 LAB — COMPREHENSIVE METABOLIC PANEL
ALT: 16 U/L (ref 0–53)
AST: 18 U/L (ref 0–37)
BUN: 17 mg/dL (ref 6–23)
CO2: 28 mEq/L (ref 19–32)
Calcium: 9.4 mg/dL (ref 8.4–10.5)
Chloride: 99 mEq/L (ref 96–112)
Glucose, Bld: 221 mg/dL — ABNORMAL HIGH (ref 70–99)
Sodium: 137 mEq/L (ref 135–145)
Total Bilirubin: 0.9 mg/dL (ref 0.2–1.2)

## 2014-08-19 LAB — COMPREHENSIVE METABOLIC PANEL WITH GFR
Albumin: 4.6 g/dL (ref 3.5–5.2)
Alkaline Phosphatase: 56 U/L (ref 39–117)
Creat: 1.46 mg/dL — ABNORMAL HIGH (ref 0.50–1.35)
Potassium: 4 meq/L (ref 3.5–5.3)
Total Protein: 6.9 g/dL (ref 6.0–8.3)

## 2014-08-19 LAB — HEMOGLOBIN A1C
Hgb A1c MFr Bld: 9.5 % — ABNORMAL HIGH (ref <5.7)
Mean Plasma Glucose: 226 mg/dL — ABNORMAL HIGH (ref <117)

## 2014-08-19 MED ORDER — DULAGLUTIDE 0.75 MG/0.5ML ~~LOC~~ SOAJ: 0.5000 mL | SUBCUTANEOUS | Status: DC

## 2014-08-19 MED ORDER — ICOSAPENT ETHYL 1 G PO CAPS: 1.0000 | ORAL_CAPSULE | Freq: Two times a day (BID) | ORAL | Status: DC

## 2014-08-19 NOTE — Addendum Note (Signed)
Addended by: Monica BectonHEKKEKANDAM, THOMAS J on: 08/19/2014 05:22 PM   Modules accepted: Orders

## 2014-09-16 ENCOUNTER — Encounter: Payer: Self-pay | Admitting: Sports Medicine

## 2014-09-16 ENCOUNTER — Ambulatory Visit (INDEPENDENT_AMBULATORY_CARE_PROVIDER_SITE_OTHER): Payer: BLUE CROSS/BLUE SHIELD | Admitting: Sports Medicine

## 2014-09-16 VITALS — BP 128/83 | HR 94 | Ht 71.0 in | Wt 212.0 lb

## 2014-09-16 DIAGNOSIS — N489 Disorder of penis, unspecified: Secondary | ICD-10-CM

## 2014-09-16 DIAGNOSIS — N4889 Other specified disorders of penis: Secondary | ICD-10-CM

## 2014-09-16 DIAGNOSIS — E119 Type 2 diabetes mellitus without complications: Secondary | ICD-10-CM

## 2014-09-16 DIAGNOSIS — N139 Obstructive and reflux uropathy, unspecified: Secondary | ICD-10-CM | POA: Diagnosis not present

## 2014-09-16 NOTE — Progress Notes (Signed)
  Subjective:    CC: Follow-up  HPI: Penile rash: Resolved now with Lotrisone.  Diabetes mellitus type 2: Currently on multiple medications, last hemoglobin A1c was 9, due for recheck in 2 months. We started a GLP-1 inhibitor injection at the last visit.  Obstructive uropathy: Doing well with Flomax when he remembers to take it.  Past medical history, Surgical history, Family history not pertinant except as noted below, Social history, Allergies, and medications have been entered into the medical record, reviewed, and no changes needed.   Review of Systems: No fevers, chills, night sweats, weight loss, chest pain, or shortness of breath.   Objective:    General: Well Developed, well nourished, and in no acute distress.  Neuro: Alert and oriented x3, extra-ocular muscles intact, sensation grossly intact.  HEENT: Normocephalic, atraumatic, pupils equal round reactive to light, neck supple, no masses, no lymphadenopathy, thyroid nonpalpable.  Skin: Warm and dry, no rashes. Cardiac: Regular rate and rhythm, no murmurs rubs or gallops, no lower extremity edema.  Respiratory: Clear to auscultation bilaterally. Not using accessory muscles, speaking in full sentences.  Impression and Recommendations:

## 2014-09-16 NOTE — Assessment & Plan Note (Signed)
Overall doing well with Flomax when he remembers to take it.

## 2014-09-16 NOTE — Assessment & Plan Note (Signed)
Resolved with Lotrisone 

## 2014-09-16 NOTE — Assessment & Plan Note (Signed)
Overall doing well on current diabetes medications. We will recheck A1c in 2 months.

## 2014-09-24 ENCOUNTER — Encounter: Payer: Self-pay | Admitting: Sports Medicine

## 2014-09-26 ENCOUNTER — Encounter: Payer: Self-pay | Admitting: Sports Medicine

## 2014-09-29 MED ORDER — ACYCLOVIR 400 MG PO TABS: 400.0000 mg | ORAL_TABLET | Freq: Two times a day (BID) | ORAL | Status: DC

## 2014-10-11 ENCOUNTER — Emergency Department (INDEPENDENT_AMBULATORY_CARE_PROVIDER_SITE_OTHER)
Admission: EM | Admit: 2014-10-11 | Discharge: 2014-10-11 | Disposition: A | Discharge status: Home / Self Care | Payer: BLUE CROSS/BLUE SHIELD | Source: Home / Self Care | Attending: Sports Medicine | Admitting: Sports Medicine

## 2014-10-11 ENCOUNTER — Emergency Department (INDEPENDENT_AMBULATORY_CARE_PROVIDER_SITE_OTHER): Payer: BLUE CROSS/BLUE SHIELD

## 2014-10-11 ENCOUNTER — Encounter: Payer: Self-pay | Admitting: Emergency Medicine

## 2014-10-11 ENCOUNTER — Emergency Department
Admission: EM | Admit: 2014-10-11 | Discharge: 2014-10-11 | Disposition: A | Discharge status: Still In Hospital | Payer: BLUE CROSS/BLUE SHIELD | Source: Home / Self Care

## 2014-10-11 DIAGNOSIS — M5136 Other intervertebral disc degeneration, lumbar region: Secondary | ICD-10-CM | POA: Diagnosis not present

## 2014-10-11 DIAGNOSIS — M5032 Other cervical disc degeneration, mid-cervical region: Secondary | ICD-10-CM | POA: Diagnosis not present

## 2014-10-11 MED ORDER — CYCLOBENZAPRINE HCL 10 MG PO TABS: 10.0000 mg | ORAL_TABLET | Freq: Every day | ORAL | Status: DC

## 2014-10-11 MED ORDER — KETOROLAC TROMETHAMINE 30 MG/ML IJ SOLN
30.0000 mg | Freq: Once | INTRAMUSCULAR | Status: AC
  Administered 2014-10-11: 30 mg via INTRAVENOUS

## 2014-10-11 MED ORDER — LIDOCAINE HCL 1 % IJ SOLN
1.0000 mL | Freq: Once | INTRAMUSCULAR | Status: AC
  Administered 2014-10-11: 1 mL

## 2014-10-11 MED ORDER — PREDNISONE 50 MG PO TABS: 50.0000 mg | ORAL_TABLET | Freq: Every day | ORAL | Status: DC

## 2014-10-11 NOTE — ED Notes (Signed)
Pt c/o back pain that started suddenly on Thursday. No known injury.

## 2014-10-11 NOTE — ED Provider Notes (Signed)
  Subjective:    CC: Mid back and neck pain  HPI: This is a pleasant 59 year old male, he is a patient of mine, who comes in with a 2 day history of increasing pain in his neck, left periscapular region with radiation down the left arm, no constitutional symptoms, no trauma, no bowel or bladder dysfunction, saddle numbness. Pain is moderate, persistent without radiation. Unfortunately they did have to cancel a mountain trip.  Past medical history, Surgical history, Family history not pertinant except as noted below, Social history, Allergies, and medications have been entered into the medical record, reviewed, and no changes needed.   Review of Systems: No fevers, chills, night sweats, weight loss, chest pain, or shortness of breath.   Objective:    General: Well Developed, well nourished, and in no acute distress.  Neuro: Alert and oriented x3, extra-ocular muscles intact, sensation grossly intact.  HEENT: Normocephalic, atraumatic, pupils equal round reactive to light, neck supple, no masses, no lymphadenopathy, thyroid nonpalpable.  Skin: Warm and dry, no rashes. Cardiac: Regular rate and rhythm, no murmurs rubs or gallops, no lower extremity edema.  Respiratory: Clear to auscultation bilaterally. Not using accessory muscles, speaking in full sentences. Neck: Negative spurling's Full neck range of motion Grip strength and sensation normal in bilateral hands Strength weak to left elbow flexion No sensory change to C4 to T1 Reflexes normal  Procedure:  Injection of left periscapular trigger point Consent obtained and verified. Time-out conducted. Noted no overlying erythema, induration, or other signs of local infection. Skin prepped in a sterile fashion. Topical analgesic spray: Ethyl chloride. Completed without difficulty. Meds: 1 mL kenalog 40, 2 mL lidocaine injected in a fanlike pattern into the trigger point. Pain immediately improved suggesting accurate placement of the  medication. Advised to call if fevers/chills, erythema, induration, drainage, or persistent bleeding.   Impression and Recommendations:    1. Left cervical radiculopathy: We will start conservatively, cervical spine x-rays, Toradol intramuscular, trigger point injection, 5 days of prednisone, formal physical therapy, Flexeril at bedtime, he will return to see me in one month to reevaluate symptoms, MRI for interventional injection planning an epidural if no better.  Monica Bectonhomas J Thekkekandam, MD 10/11/14 1321

## 2014-10-25 ENCOUNTER — Other Ambulatory Visit: Payer: Self-pay | Admitting: Sports Medicine

## 2014-11-17 ENCOUNTER — Encounter: Payer: Self-pay | Admitting: Sports Medicine

## 2014-11-17 ENCOUNTER — Ambulatory Visit (INDEPENDENT_AMBULATORY_CARE_PROVIDER_SITE_OTHER): Payer: BLUE CROSS/BLUE SHIELD | Admitting: Sports Medicine

## 2014-11-17 VITALS — BP 148/86 | HR 61 | Ht 71.0 in | Wt 208.0 lb

## 2014-11-17 DIAGNOSIS — Z23 Encounter for immunization: Secondary | ICD-10-CM

## 2014-11-17 DIAGNOSIS — E119 Type 2 diabetes mellitus without complications: Secondary | ICD-10-CM

## 2014-11-17 DIAGNOSIS — M5136 Other intervertebral disc degeneration, lumbar region: Secondary | ICD-10-CM

## 2014-11-17 DIAGNOSIS — I10 Essential (primary) hypertension: Secondary | ICD-10-CM

## 2014-11-17 DIAGNOSIS — M51369 Other intervertebral disc degeneration, lumbar region without mention of lumbar back pain or lower extremity pain: Secondary | ICD-10-CM

## 2014-11-17 DIAGNOSIS — Z0000 Encounter for general adult medical examination without abnormal findings: Secondary | ICD-10-CM

## 2014-11-17 LAB — POCT GLYCOSYLATED HEMOGLOBIN (HGB A1C): Hemoglobin A1C: 7.9

## 2014-11-17 MED ORDER — SCOPOLAMINE 1 MG/3DAYS TD PT72: 1.0000 | MEDICATED_PATCH | TRANSDERMAL | Status: DC

## 2014-11-17 MED ORDER — AMLODIPINE BESYLATE 10 MG PO TABS: 10.0000 mg | ORAL_TABLET | Freq: Every day | ORAL | Status: DC

## 2014-11-17 MED ORDER — VALSARTAN 320 MG PO TABS: 320.0000 mg | ORAL_TABLET | Freq: Every day | ORAL | Status: DC

## 2014-11-17 MED ORDER — DULAGLUTIDE 0.75 MG/0.5ML ~~LOC~~ SOAJ: 1.0000 mL | SUBCUTANEOUS | Status: DC

## 2014-11-17 MED ORDER — AMLODIPINE BESYLATE-VALSARTAN 10-320 MG PO TABS: 1.0000 | ORAL_TABLET | Freq: Every day | ORAL | Status: DC

## 2014-11-17 NOTE — Assessment & Plan Note (Signed)
Still having some pain, referral for physical therapy.

## 2014-11-17 NOTE — Progress Notes (Signed)
  Subjective:    CC:  Follow-up  HPI: Diabetes mellitus type 2: Doing well on current oral injectables.  Lumbar degenerative disc disease: never did physical therapy. Pain is persistent. No bowel or bladder dysfunction or saddle numbness.  Hypertension: Still elevated, on current medications. No headaches, visual changes, chest pain.  Past medical history, Surgical history, Family history not pertinant except as noted below, Social history, Allergies, and medications have been entered into the medical record, reviewed, and no changes needed.   Review of Systems: No fevers, chills, night sweats, weight loss, chest pain, or shortness of breath.   Objective:    General: Well Developed, well nourished, and in no acute distress.  Neuro: Alert and oriented x3, extra-ocular muscles intact, sensation grossly intact.  HEENT: Normocephalic, atraumatic, pupils equal round reactive to light, neck supple, no masses, no lymphadenopathy, thyroid nonpalpable.  Skin: Warm and dry, no rashes. Cardiac: Regular rate and rhythm, no murmurs rubs or gallops, no lower extremity edema.  Respiratory: Clear to auscultation bilaterally. Not using accessory muscles, speaking in full sentences.  Impression and Recommendations:

## 2014-11-17 NOTE — Assessment & Plan Note (Addendum)
Still elevated, increasing  Exforge to 320/10, we do need to break this in 2 separate medications considering cost, amlodipine 10 and valsartan 320.

## 2014-11-17 NOTE — Assessment & Plan Note (Signed)
He will be A1c is improved significantly, we are going to increase Trulicity to 1.5mg .

## 2014-11-17 NOTE — Assessment & Plan Note (Signed)
Pneumococcal 23 and Zostavax given today.

## 2014-11-25 ENCOUNTER — Encounter: Payer: Self-pay | Admitting: Rehabilitative and Restorative Service Providers"

## 2014-11-25 ENCOUNTER — Ambulatory Visit (INDEPENDENT_AMBULATORY_CARE_PROVIDER_SITE_OTHER): Payer: BLUE CROSS/BLUE SHIELD | Admitting: Rehabilitative and Restorative Service Providers"

## 2014-11-25 DIAGNOSIS — M545 Low back pain, unspecified: Secondary | ICD-10-CM

## 2014-11-25 DIAGNOSIS — R293 Abnormal posture: Secondary | ICD-10-CM

## 2014-11-25 DIAGNOSIS — M2560 Stiffness of unspecified joint, not elsewhere classified: Secondary | ICD-10-CM

## 2014-11-25 DIAGNOSIS — Z7409 Other reduced mobility: Secondary | ICD-10-CM

## 2014-11-25 NOTE — Patient Instructions (Signed)
Abdominal Bracing With Pelvic Floor (Hook-Lying)   With neutral spine, tighten pelvic floor and abdominals and back at waist. Hold 10 sec Repeat _10__ times. Do several___ times a day. Transition to do this in sitting and standing and with functional activities    Trunk Extension   Standing, place back of open hands on low back. Straighten spine then arch the back and move shoulders back. Repeat __3-5__ times per session. Do _several___ sessions per day    Trunk: Prone Extension (Press-Ups)   Lie on stomach on firm, flat surface. Relax bottom and legs. Raise chest in air with elbows straight. Keep hips flat on surface, sag stomach. Hold _2-3___ seconds. Repeat __5-10__ times. Do _3___ sessions per day. CAUTION: Movement should be gentle and slow.   Quads / HF, Prone   Lie face down, knees together.  Use dog leash; towel; strap if needed to reach. Gently pull foot toward buttock. Hold __20-30_ seconds. Repeat _3__ times per session. Do _3__ sessions per day.   Hamstring Step 1   Straighten left knee. Bring left leg up until you feel a stretch through the hamstring Hold _30__ seconds. Relax knee by returning foot to start. Repeat __3_ times.   Modify sitting position for home   Work on posture with all positions   Stand/walk with chest up shoulders down and back

## 2014-11-25 NOTE — Therapy (Addendum)
[facility] [place], [place], [address] Phone: [phone]   Fax:  [phone]  Physical Therapy Evaluation  Patient Details  Name: Antonio Burns MRN: [id number] Date of Birth: [birth year] Referring Provider:  Silverio Decamp,*  Encounter Date: 11/25/2014      PT End of Session - 11/25/14 1342    Visit Number 1   Number of Visits 16   Date for PT Re-Evaluation 01/20/15   PT Start Time 1016   PT Stop Time 1120   PT Time Calculation (min) 64 min   Activity Tolerance Patient tolerated treatment well      Past Medical History  Diagnosis Date  . Hyperlipidemia   . Hypertension   . Diabetes mellitus without complication   . Sleep apnea     History reviewed. No pertinent past surgical history.  There were no vitals filed for this visit.  Visit Diagnosis:  Midline low back pain without sciatica - Plan: PT plan of care cert/re-cert  Right-sided low back pain without sciatica - Plan: PT plan of care cert/re-cert  Stiffness in joint - Plan: PT plan of care cert/re-cert  Posture abnormality - Plan: PT plan of care cert/re-cert  Decreased mobility and endurance - Plan: PT plan of care cert/re-cert      Subjective Assessment - 11/25/14 1013    Subjective Patient reports LBP problems over the past 20 years with symptoms progressively worsening over the past year. He has increased symptoms with standing or walking on concrete surfaces.   Pertinent History See medical record.   How long can you sit comfortably? firm chair 20 min; soft chair 60 min   How long can you stand comfortably? 10-20 min   How long can you walk comfortably? concrete 10 min; grass/ground 30 min    Diagnostic tests xray - DDD Lumbar spine   Pain Score 2    Pain Location Back   Pain Orientation Right;Mid   Pain Descriptors / Indicators Dull;Nagging;Burning   Pain Type Chronic pain   Pain Radiating Towards Rt buttock   Pain Onset More than a month ago   Pain Frequency Intermittent   Aggravating Factors  standing/walking on concrete; lifting; bending; stairs; prolonged sitting   Pain Relieving Factors changing positions; lying on side; OTC antiinflammatory            [facility] PT Assessment - 11/25/14 0001    Assessment   Medical Diagnosis DDD lumbar spine   Onset Date/Surgical Date 11/21/14   Hand Dominance Right   Next MD Visit 12/18/14   Balance Screen   Has the patient fallen in the past 6 months No   Has the patient had a decrease in activity level because of a fear of falling?  No   Is the patient reluctant to leave their home because of a fear of falling?  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   Leisure some household chores restores antiques   Observation/Other Assessments   Observations poor posture in sitting trunk rounded increased lumbar flexion    Focus  on Therapeutic Outcomes (FOTO)  53% limitation   Sensation   Additional Comments WFL's per pt report   Posture/Postural Control   Posture Comments head forward; shoulders rounded and elevated; head of the humerus anterior in orientation; increased thoracic kyphosis; head of the humerus anterior in orientation; decreased lumbar lordosis   AROM   Lumbar Flexion 60%  tightness Rt LB   Lumbar Extension 40%  tightness Rt LB    Lumbar - Right Side Bend 75%  pain Rt LB   Lumbar - Left Side Bend 70%   Lumbar - Right Rotation 80%   Lumbar - Left Rotation 80%   Strength   Overall Strength Comments WFL's throughout    Flexibility   Soft Tissue Assessment  /Muscle Length --  HS Rt 52 deg/Lt 60; quad prone Rt 112 deg/Lt 115   Palpation   Spinal mobility decreased PA mobility through lumbar spine greatest L3/4    Palpation comment some tightness through Lt quadratus           [facility] Adult PT Treatment/Exercise - 11/25/14 0001    Lumbar Exercises: Stretches   Passive Hamstring Stretch 3 reps;30 seconds   Passive Hamstring Stretch Limitations supine with strap   Press Ups 5 reps  2 sec to patient tolerance   Quad Stretch 3 reps;30 seconds   Quad Stretch Limitations prone with strap   Lumbar Exercises: Standing   Other Standing Lumbar Exercises trunk extension 1-2 sec 3-4 reps   Lumbar Exercises: Supine   AB Set Limitations 3 part core 10 sec 10 reps   Cryotherapy   Number Minutes Cryotherapy 15 Minutes   Cryotherapy Location Lumbar Spine   Type of Cryotherapy Ice pack                PT Education - 11/25/14 1101    Education provided Yes   Education Details Posture/alignment; ergonomics for sitting; HEP; DDD related to rehab   [employer]) Educated Patient   Methods Explanation;Demonstration;Tactile cues;Verbal cues;Handout   Comprehension Verbalized understanding;Returned demonstration;Verbal cues required;Tactile cues required          PT Short Term Goals - 11/25/14 1349    PT SHORT TERM GOAL #1   Title Patient I ininitial HEP 12/23/14   Time 4   Period Weeks   Status New   PT SHORT TERM GOAL #2   Title Increase hamstring ROM by 10-15 degrees 12/23/14   Time 4   Period Weeks   Status New   PT SHORT TERM GOAL #3   Title Improve posture and alignment in standing with patient to demo more upright posture with improved spinal alignment 12/23/14   Time 4   Period Weeks   Status New           PT Long Term Goals - 11/25/14 1350    PT LONG TERM GOAL #1   Title Patient I in HEP for discharge 01/20/15   Time 8   Period Weeks   Status New   PT LONG TERM GOAL #2   Title Decrease frequency, intensity and duration  of pain by 50% 01/20/15   Time 8   Period Weeks   Status New   PT LONG TERM GOAL #3   Title Improve hamstring flexibility by 15-20 degrees and quad flexibility by 10 degrees 01/20/15   Time 4   Period Weeks   Status New   PT LONG TERM GOAL #4   Title Decrease FOTO to </=34% limitatioin 01/20/15   Time 8  Period Weeks   Status New               Plan - 11/25/14 1343    Clinical Impression Statement Patient presents with chronic LBP with excerbation of symptoms over the past year. He has poor posture and alignment; decreased trunk and LE mobility and ROM; pain on a daily basis; sedentary lifestyle. Patient will benetif from Physical Therapy treatment to address muscular tenderness and tightness and improve mobility and ROM; improve posture and alignment; incresed activity level and decrease pain.   Rehab Potential Good   Clinical Impairments Affecting Rehab Potential shronic nature of LBP   PT Frequency 2x / week   PT Duration 8 weeks   PT Treatment/Interventions Patient/family education;ADLs/Self Care Home Management;Therapeutic exercise;Therapeutic activities;Neuromuscular re-education;Cryotherapy;Electrical Stimulation;Moist Heat;Traction;Ultrasound;Manual techniques   PT Next Visit Plan Review exercises; progress lumbar stabilization - - add pec stretch, chest lift, chin tuck to improve spinal posture and alignment   PT Home Exercise Plan Postural correction; ergonimic modifications with sitting; HEP   Consulted and Agree with Plan of Care Patient         Problem List Patient Active Problem List   Diagnosis Date Noted  . Idiopathic thrombocytopenic purpura 08/18/2014  . Lumbar degenerative disc disease 05/12/2014  . Obstructive uropathy 05/12/2014  . Genital herpes 05/12/2014  . Seborrheic keratosis 03/19/2014  . Erectile dysfunction 03/19/2014  . Annual physical exam 03/19/2014  . Hyperlipidemia   . Hypertension   . Diabetes mellitus without complication   .  Sleep apnea     Corianne Buccellato Nilda Simmer, PT, MPH 11/25/2014, 1:58 PM  [facility] [place], [place], [address] Phone: [phone]   Fax:  [phone]    PHYSICAL THERAPY DISCHARGE SUMMARY  Visits from Start of Care: 1  Current functional level related to goals / functional outcomes: Patient was seen for initial evaluation only. He was concerned with his high co-pay and stated that he would call if her wished to schedule additional appointments   Remaining deficits: unknown   Education / Equipment: HEP  Plan: Patient agrees to discharge.  Patient goals were not met. Patient is being discharged due to not returning since the last visit.  ?????   Jabaree Mercado P. Helene Kelp, PT. MPH 12/18/14 8:20am

## 2014-11-27 ENCOUNTER — Encounter: Payer: BLUE CROSS/BLUE SHIELD | Admitting: Rehabilitative and Restorative Service Providers"

## 2014-12-15 ENCOUNTER — Ambulatory Visit (INDEPENDENT_AMBULATORY_CARE_PROVIDER_SITE_OTHER): Payer: BLUE CROSS/BLUE SHIELD | Admitting: Sports Medicine

## 2014-12-15 ENCOUNTER — Encounter: Payer: Self-pay | Admitting: Sports Medicine

## 2014-12-15 VITALS — BP 141/82 | HR 78 | Ht 71.0 in | Wt 209.0 lb

## 2014-12-15 DIAGNOSIS — R0789 Other chest pain: Secondary | ICD-10-CM

## 2014-12-15 DIAGNOSIS — E119 Type 2 diabetes mellitus without complications: Secondary | ICD-10-CM

## 2014-12-15 DIAGNOSIS — I10 Essential (primary) hypertension: Secondary | ICD-10-CM | POA: Diagnosis not present

## 2014-12-15 MED ORDER — UMECLIDINIUM-VILANTEROL 62.5-25 MCG/INH IN AEPB: 1.0000 | INHALATION_SPRAY | Freq: Every day | RESPIRATORY_TRACT | Status: DC

## 2014-12-15 NOTE — Assessment & Plan Note (Signed)
Still elevated but did not take his medication yesterday, and just took it this morning. Currently taking Exforge 320/10, was able to obtain a generic version of this. Return in 2 weeks for nurse visit.

## 2014-12-15 NOTE — Assessment & Plan Note (Signed)
Doing well with Trulicity. Return in 2 months for a repeat A1c

## 2014-12-15 NOTE — Progress Notes (Signed)
  Subjective:    CC: Follow-up  HPI: Hypertension: Blood pressure is still elevated however he tells me he did not take his medication yesterday and just took it this morning.  Chest tightness: History of asthma as a child, he is a former smoker, he has had a nuclear stress test within 5 years as well as pre-and postbronchodilator spirometry within 5 years that was negative. He gets chest pain, tightness with exertion but without nausea, diaphoresis, palpitations, presyncope, or radiation to the neck or jaw. Symptoms are mild, intermittent.  Past medical history, Surgical history, Family history not pertinant except as noted below, Social history, Allergies, and medications have been entered into the medical record, reviewed, and no changes needed.   Review of Systems: No fevers, chills, night sweats, weight loss, chest pain, or shortness of breath.   Objective:    General: Well Developed, well nourished, and in no acute distress.  Neuro: Alert and oriented x3, extra-ocular muscles intact, sensation grossly intact.  HEENT: Normocephalic, atraumatic, pupils equal round reactive to light, neck supple, no masses, no lymphadenopathy, thyroid nonpalpable.  Skin: Warm and dry, no rashes. Cardiac: Regular rate and rhythm, no murmurs rubs or gallops, no lower extremity edema.  Respiratory: Clear to auscultation bilaterally. Not using accessory muscles, speaking in full sentences.  Impression and Recommendations:    I spent 40 minutes with this patient, greater than 50% was face-to-face time counseling regarding the above diagnoses

## 2014-12-15 NOTE — Assessment & Plan Note (Signed)
With a negative spirometry as well as negative nuclear stress test within the past 5 years. This likely represents mild exercise-induced breath constriction, I'm going to add Anoro for use daily.

## 2014-12-29 ENCOUNTER — Ambulatory Visit (INDEPENDENT_AMBULATORY_CARE_PROVIDER_SITE_OTHER): Payer: BLUE CROSS/BLUE SHIELD | Admitting: Sports Medicine

## 2014-12-29 VITALS — BP 148/83 | HR 100 | Wt 213.0 lb

## 2014-12-29 DIAGNOSIS — I10 Essential (primary) hypertension: Secondary | ICD-10-CM

## 2014-12-29 MED ORDER — CARVEDILOL 6.25 MG PO TABS: 6.2500 mg | ORAL_TABLET | Freq: Two times a day (BID) | ORAL | Status: DC

## 2014-12-29 NOTE — Progress Notes (Signed)
   Subjective:    Patient ID: Antonio Burns, male    DOB: Jan 23, 1956, 59 y.o.   MRN: [id number]  HPI  Antonio Burns is here for blood pressure check. Denies chest pain, shortness of breath, dizziness or headaches. He reports taking his blood pressure medication daily.   Review of Systems     Objective:   Physical Exam        Assessment & Plan:  Hypertension - blood pressure still elevated today.

## 2014-12-29 NOTE — Assessment & Plan Note (Signed)
Unfortunately still hypertensive, adding carvedilol, recheck blood pressure in a nurse visit again after 2 weeks of medication.

## 2014-12-29 NOTE — Progress Notes (Signed)
Patient advised of recommendations and scheduled.  

## 2015-01-08 ENCOUNTER — Encounter: Payer: Self-pay | Admitting: Sports Medicine

## 2015-01-08 MED ORDER — PREDNISONE 50 MG PO TABS
ORAL_TABLET | ORAL | Status: DC
Start: 2015-01-08 — End: 2015-04-30

## 2015-01-13 ENCOUNTER — Ambulatory Visit (INDEPENDENT_AMBULATORY_CARE_PROVIDER_SITE_OTHER): Payer: BLUE CROSS/BLUE SHIELD | Admitting: Sports Medicine

## 2015-01-13 VITALS — BP 131/84 | HR 96

## 2015-01-13 DIAGNOSIS — I10 Essential (primary) hypertension: Secondary | ICD-10-CM | POA: Diagnosis not present

## 2015-01-13 NOTE — Assessment & Plan Note (Signed)
Blood pressure is finally controlled, we did add carvedilol

## 2015-01-13 NOTE — Progress Notes (Signed)
Patient advised.

## 2015-01-13 NOTE — Progress Notes (Signed)
   Subjective:    Patient ID: Antonio Burns, male    DOB: 12/25/1955, 59 y.o.   MRN: [id number]  HPI  Onis is here for blood pressure check. Denies any problems with Coreg.   Review of Systems     Objective:   Physical Exam        Assessment & Plan:  Hypertension - blood pressure 131/84

## 2015-02-03 ENCOUNTER — Other Ambulatory Visit: Payer: Self-pay | Admitting: Sports Medicine

## 2015-02-24 ENCOUNTER — Encounter: Payer: Self-pay | Admitting: Sports Medicine

## 2015-02-24 ENCOUNTER — Ambulatory Visit (INDEPENDENT_AMBULATORY_CARE_PROVIDER_SITE_OTHER): Payer: BLUE CROSS/BLUE SHIELD | Admitting: Sports Medicine

## 2015-02-24 VITALS — BP 126/78 | HR 84 | Wt 209.0 lb

## 2015-02-24 DIAGNOSIS — I10 Essential (primary) hypertension: Secondary | ICD-10-CM

## 2015-02-24 DIAGNOSIS — E119 Type 2 diabetes mellitus without complications: Secondary | ICD-10-CM

## 2015-02-24 DIAGNOSIS — L989 Disorder of the skin and subcutaneous tissue, unspecified: Secondary | ICD-10-CM | POA: Insufficient documentation

## 2015-02-24 DIAGNOSIS — E785 Hyperlipidemia, unspecified: Secondary | ICD-10-CM | POA: Diagnosis not present

## 2015-02-24 LAB — LIPID PANEL
Cholesterol: 160 mg/dL (ref 125–200)
HDL: 27 mg/dL — ABNORMAL LOW (ref >=40)
LDL Cholesterol: 80 mg/dL (ref <130)
Total CHOL/HDL Ratio: 5.9 ratio — ABNORMAL HIGH (ref <=5.0)
Triglycerides: 266 mg/dL — ABNORMAL HIGH (ref <150)
VLDL: 53 mg/dL — ABNORMAL HIGH (ref <30)

## 2015-02-24 LAB — COMPREHENSIVE METABOLIC PANEL
ALT: 17 U/L (ref 9–46)
AST: 15 U/L (ref 10–35)
Albumin: 4.6 g/dL (ref 3.6–5.1)
Alkaline Phosphatase: 48 U/L (ref 40–115)
CO2: 28 mmol/L (ref 20–31)
Calcium: 9.8 mg/dL (ref 8.6–10.3)
Creat: 1.55 mg/dL — ABNORMAL HIGH (ref 0.70–1.25)
Glucose, Bld: 214 mg/dL — ABNORMAL HIGH (ref 65–99)
Total Bilirubin: 1 mg/dL (ref 0.2–1.2)
Total Protein: 7.6 g/dL (ref 6.1–8.1)

## 2015-02-24 LAB — COMPREHENSIVE METABOLIC PANEL WITH GFR
BUN: 20 mg/dL (ref 7–25)
Chloride: 101 mmol/L (ref 98–110)
Potassium: 4 mmol/L (ref 3.5–5.3)
Sodium: 138 mmol/L (ref 135–146)

## 2015-02-24 LAB — LDL CHOLESTEROL, DIRECT: Direct LDL: 39 mg/dL (ref <130)

## 2015-02-24 LAB — POCT GLYCOSYLATED HEMOGLOBIN (HGB A1C): Hemoglobin A1C: 9

## 2015-02-24 LAB — HEMOGLOBIN A1C
Hgb A1c MFr Bld: 8.9 % — ABNORMAL HIGH (ref <5.7)
Mean Plasma Glucose: 209 mg/dL — ABNORMAL HIGH (ref <117)

## 2015-02-24 MED ORDER — MUPIROCIN 2 % EX OINT: TOPICAL_OINTMENT | CUTANEOUS | Status: DC

## 2015-02-24 NOTE — Assessment & Plan Note (Signed)
Blood pressure is now controlled with Exforge and Coreg.

## 2015-02-24 NOTE — Addendum Note (Signed)
Addended by: Willey BladeUNNINGHAM, RHONDA C on: 02/24/2015 11:22 AM   Modules accepted: Orders

## 2015-02-24 NOTE — Assessment & Plan Note (Signed)
Was overall doing well on metformin, Glyxambi, Trulicity. Unfortunately hemoglobin A1c has worsened to 9, I am going to send him downstairs for blood work, we are going to add a lab hemoglobin A1c as well.

## 2015-02-24 NOTE — Progress Notes (Signed)
  Subjective:    CC: follow-up  HPI: Hypertension: Well controlled  Diabetes mellitus type 2: Uncontrolled, previously was 7, now hemoglobin A1c is 9 despite Glyxambi, metformin, Trulicity. He did recently return from a cruise where he binged on multiple things.  Hyperlipidemia: Stable  Skin lesion: Right lower abdomen, erythematous, painful. Also has a dark lesion on his left scalp.  Past medical history, Surgical history, Family history not pertinant except as noted below, Social history, Allergies, and medications have been entered into the medical record, reviewed, and no changes needed.   Review of Systems: No fevers, chills, night sweats, weight loss, chest pain, or shortness of breath.   Objective:    General: Well Developed, well nourished, and in no acute distress.  Neuro: Alert and oriented x3, extra-ocular muscles intact, sensation grossly intact.  HEENT: Normocephalic, atraumatic, pupils equal round reactive to light, neck supple, no masses, no lymphadenopathy, thyroid nonpalpable.  Skin: Warm and dry, furuncle on the right lower abdomen, hypopigmented 1 cm worrisome lesion on the scalp. Cardiac: Regular rate and rhythm, no murmurs rubs or gallops, no lower extremity edema.  Respiratory: Clear to auscultation bilaterally. Not using accessory muscles, speaking in full sentences.  Impression and Recommendations:    I spent 25 minutes with this patient, greater than 50% was face-to-face time counseling regarding the above diagnoses

## 2015-02-24 NOTE — Assessment & Plan Note (Signed)
Elevated triglycerides in the past, now on Vascepa,TriCor, Lipitor, rechecking lipids.

## 2015-02-24 NOTE — Assessment & Plan Note (Signed)
There is a hyperpigmented lesion on the scalp that we will biopsy at later date. He also has a furuncle on his abdomen, we are going to treat this with mupirocin.

## 2015-03-03 ENCOUNTER — Ambulatory Visit (INDEPENDENT_AMBULATORY_CARE_PROVIDER_SITE_OTHER): Payer: BLUE CROSS/BLUE SHIELD | Admitting: Sports Medicine

## 2015-03-03 ENCOUNTER — Encounter: Payer: Self-pay | Admitting: Sports Medicine

## 2015-03-03 VITALS — BP 131/78 | HR 90 | Wt 213.0 lb

## 2015-03-03 DIAGNOSIS — Z23 Encounter for immunization: Secondary | ICD-10-CM

## 2015-03-03 DIAGNOSIS — L989 Disorder of the skin and subcutaneous tissue, unspecified: Secondary | ICD-10-CM

## 2015-03-03 NOTE — Progress Notes (Signed)
  Subjective:    CC:  Skin lesion  HPI: Antonio Burns returns, initially we noted a hyperpigmented lesion on the left side of his head, today we notice a second lesion on the right side, hyperpigmented, approximately 1 cm across, asymptomatic. Symptoms are persistent.  Past medical history, Surgical history, Family history not pertinant except as noted below, Social history, Allergies, and medications have been entered into the medical record, reviewed, and no changes needed.   Review of Systems: No fevers, chills, night sweats, weight loss, chest pain, or shortness of breath.   Objective:    General: Well Developed, well nourished, and in no acute distress.  Neuro: Alert and oriented x3, extra-ocular muscles intact, sensation grossly intact.  HEENT: Normocephalic, atraumatic, pupils equal round reactive to light, neck supple, no masses, no lymphadenopathy, thyroid nonpalpable.  Skin: Warm and dry, no rashes. There are two 1 cm lesions on both sides of the scalp , they are somewhat very getting with irregular borders , hyperpigmented. Cardiac: Regular rate and rhythm, no murmurs rubs or gallops, no lower extremity edema.  Respiratory: Clear to auscultation bilaterally. Not using accessory muscles, speaking in full sentences.  Procedure:  Excision of left and right hyperpigmented skin lesions Risks, benefits, and alternatives explained and consent obtained. Time out conducted. Surface prepped with alcohol. 5cc lidocaine with epinephine infiltrated in a field block. Adequate anesthesia ensured. Area prepped and draped in a sterile fashion. Excision performed with:using a 6 mm punch biopsy I took samples of each of the lesions, the skin defects were then closed with a single horizontal mattress 5-0 Ethilon suture Hemostasis achieved. Pt stable.  Impression and Recommendations:

## 2015-03-03 NOTE — Assessment & Plan Note (Signed)
Noted a couple of skin lesions on left and right scalp. Punch biopsy 2, return in one week for suture removal.

## 2015-03-10 ENCOUNTER — Encounter: Payer: Self-pay | Admitting: Sports Medicine

## 2015-03-10 ENCOUNTER — Ambulatory Visit (INDEPENDENT_AMBULATORY_CARE_PROVIDER_SITE_OTHER): Payer: BLUE CROSS/BLUE SHIELD | Admitting: Sports Medicine

## 2015-03-10 VITALS — BP 134/77 | HR 85 | Wt 211.0 lb

## 2015-03-10 DIAGNOSIS — E119 Type 2 diabetes mellitus without complications: Secondary | ICD-10-CM

## 2015-03-10 DIAGNOSIS — A6000 Herpesviral infection of urogenital system, unspecified: Secondary | ICD-10-CM

## 2015-03-10 DIAGNOSIS — L989 Disorder of the skin and subcutaneous tissue, unspecified: Secondary | ICD-10-CM

## 2015-03-10 MED ORDER — GLIPIZIDE 10 MG PO TABS: 10.0000 mg | ORAL_TABLET | Freq: Two times a day (BID) | ORAL | Status: DC

## 2015-03-10 MED ORDER — DULAGLUTIDE 1.5 MG/0.5ML ~~LOC~~ SOAJ: SUBCUTANEOUS | Status: DC

## 2015-03-10 NOTE — Assessment & Plan Note (Signed)
Moderate dose glipizide.

## 2015-03-10 NOTE — Progress Notes (Signed)
  Subjective:    CC: Follow-up  HPI: Diabetes mellitus type 2: A1c increased to 9, he is currently only doing half dose of Trulicity.  Skin lesions: Excision one week ago, the left scalp was premalignant, the right scalp lesion was benign, here for suture removal.  Past medical history, Surgical history, Family history not pertinant except as noted below, Social history, Allergies, and medications have been entered into the medical record, reviewed, and no changes needed.   Review of Systems: No fevers, chills, night sweats, weight loss, chest pain, or shortness of breath.   Objective:    General: Well Developed, well nourished, and in no acute distress.  Neuro: Alert and oriented x3, extra-ocular muscles intact, sensation grossly intact.  HEENT: Normocephalic, atraumatic, pupils equal round reactive to light, neck supple, no masses, no lymphadenopathy, thyroid nonpalpable.  Skin: Warm and dry, no rashes. Cardiac: Regular rate and rhythm, no murmurs rubs or gallops, no lower extremity edema.  Respiratory: Clear to auscultation bilaterally. Not using accessory muscles, speaking in full sentences. Incisions: Clean, dry, intact, simple interrupted sutures were removed today.  Impression and Recommendations:    I spent 40 minutes with this patient, greater than 50% was face-to-face time counseling regarding the above diagnoses

## 2015-03-10 NOTE — Assessment & Plan Note (Signed)
Sutures removed from the 2 lesions, they are healing well, the left side was premalignant, the right side was benign. Surgically cured.

## 2015-03-11 LAB — HEPATITIS PANEL, ACUTE
HCV Ab: NEGATIVE
Hep A IgM: NONREACTIVE
Hep B C IgM: NONREACTIVE
Hepatitis B Surface Ag: NEGATIVE

## 2015-03-11 LAB — HIV ANTIBODY (ROUTINE TESTING W REFLEX): HIV 1&2 Ab, 4th Generation: NONREACTIVE

## 2015-03-18 ENCOUNTER — Other Ambulatory Visit: Payer: Self-pay | Admitting: Sports Medicine

## 2015-03-26 ENCOUNTER — Other Ambulatory Visit: Payer: Self-pay | Admitting: *Deleted

## 2015-03-26 DIAGNOSIS — I10 Essential (primary) hypertension: Secondary | ICD-10-CM

## 2015-03-26 MED ORDER — CARVEDILOL 6.25 MG PO TABS: 6.2500 mg | ORAL_TABLET | Freq: Two times a day (BID) | ORAL | Status: DC

## 2015-03-30 ENCOUNTER — Other Ambulatory Visit: Payer: Self-pay | Admitting: Sports Medicine

## 2015-04-28 ENCOUNTER — Other Ambulatory Visit: Payer: Self-pay | Admitting: Sports Medicine

## 2015-04-30 ENCOUNTER — Ambulatory Visit (INDEPENDENT_AMBULATORY_CARE_PROVIDER_SITE_OTHER): Payer: BLUE CROSS/BLUE SHIELD | Admitting: Sports Medicine

## 2015-04-30 ENCOUNTER — Encounter: Payer: Self-pay | Admitting: Sports Medicine

## 2015-04-30 DIAGNOSIS — M5136 Other intervertebral disc degeneration, lumbar region: Secondary | ICD-10-CM

## 2015-04-30 DIAGNOSIS — M51369 Other intervertebral disc degeneration, lumbar region without mention of lumbar back pain or lower extremity pain: Secondary | ICD-10-CM

## 2015-04-30 MED ORDER — MELOXICAM 15 MG PO TABS: ORAL_TABLET | ORAL | Status: DC

## 2015-04-30 MED ORDER — CYCLOBENZAPRINE HCL 10 MG PO TABS: ORAL_TABLET | ORAL | Status: DC

## 2015-04-30 NOTE — Progress Notes (Signed)
  Subjective:    CC: Low back pain  HPI: This is a pleasant 59 year old male, he has known lumbar degenerative disc disease at the L4-L5 and L5-S1 levels, currently having recurrence of pain that is axial, right-sided without overt radiculopathy. Worse with walking, and going from a sitting to a standing position, no bowel or bladder dysfunction, saddle numbness, no trauma, no constitutional symptoms, did do a bit of physical therapy without much improvement. At this point not yet rate proceed with intervention.  Past medical history, Surgical history, Family history not pertinant except as noted below, Social history, Allergies, and medications have been entered into the medical record, reviewed, and no changes needed.   Review of Systems: No fevers, chills, night sweats, weight loss, chest pain, or shortness of breath.   Objective:    General: Well Developed, well nourished, and in no acute distress.  Neuro: Alert and oriented x3, extra-ocular muscles intact, sensation grossly intact.  HEENT: Normocephalic, atraumatic, pupils equal round reactive to light, neck supple, no masses, no lymphadenopathy, thyroid nonpalpable.  Skin: Warm and dry, no rashes. Cardiac: Regular rate and rhythm, no murmurs rubs or gallops, no lower extremity edema.  Respiratory: Clear to auscultation bilaterally. Not using accessory muscles, speaking in full sentences. Back Exam:  Inspection: Unremarkable  Motion: Flexion 45 deg, Extension 45 deg, Side Bending to 45 deg bilaterally,  Rotation to 45 deg bilaterally  SLR laying: Negative  XSLR laying: Negative  Palpable tenderness: Right paralumbar muscles. FABER: negative. Sensory change: Gross sensation intact to all lumbar and sacral dermatomes.  Reflexes: 2+ at both patellar tendons, 2+ at achilles tendons, Babinski's downgoing.  Strength at foot  Plantar-flexion: 5/5 Dorsi-flexion: 5/5 Eversion: 5/5 Inversion: 5/5  Leg strength  Quad: 5/5 Hamstring: 5/5 Hip  flexor: 5/5 Hip abductors: 5/5  Gait unremarkable.  Impression and Recommendations:

## 2015-04-30 NOTE — Assessment & Plan Note (Signed)
Adding meloxicam, Flexeril, rehabilitation exercises. Does have L4-L5 and L5-S1 degenerative changes on x-ray. Has failed one session of physical therapy but not yet ready to proceed with MRI for intervention. Next line return in 4-6 weeks.

## 2015-05-18 ENCOUNTER — Encounter: Payer: Self-pay | Admitting: Sports Medicine

## 2015-05-18 DIAGNOSIS — M51369 Other intervertebral disc degeneration, lumbar region without mention of lumbar back pain or lower extremity pain: Secondary | ICD-10-CM

## 2015-05-18 DIAGNOSIS — M5136 Other intervertebral disc degeneration, lumbar region: Secondary | ICD-10-CM

## 2015-05-20 ENCOUNTER — Other Ambulatory Visit: Payer: Self-pay | Admitting: Sports Medicine

## 2015-05-20 ENCOUNTER — Encounter: Payer: Self-pay | Admitting: Sports Medicine

## 2015-05-20 ENCOUNTER — Ambulatory Visit (INDEPENDENT_AMBULATORY_CARE_PROVIDER_SITE_OTHER): Payer: BLUE CROSS/BLUE SHIELD | Admitting: Sports Medicine

## 2015-05-20 VITALS — BP 148/90 | HR 101 | Temp 97.8°F | Resp 18 | Wt 224.3 lb

## 2015-05-20 DIAGNOSIS — I10 Essential (primary) hypertension: Secondary | ICD-10-CM | POA: Diagnosis not present

## 2015-05-20 DIAGNOSIS — E119 Type 2 diabetes mellitus without complications: Secondary | ICD-10-CM

## 2015-05-20 DIAGNOSIS — M5136 Other intervertebral disc degeneration, lumbar region: Secondary | ICD-10-CM | POA: Diagnosis not present

## 2015-05-20 DIAGNOSIS — M51369 Other intervertebral disc degeneration, lumbar region without mention of lumbar back pain or lower extremity pain: Secondary | ICD-10-CM

## 2015-05-20 MED ORDER — VALSARTAN 320 MG PO TABS: 320.0000 mg | ORAL_TABLET | Freq: Every day | ORAL | Status: DC

## 2015-05-20 MED ORDER — AMLODIPINE BESYLATE 10 MG PO TABS: 10.0000 mg | ORAL_TABLET | Freq: Every day | ORAL | Status: DC

## 2015-05-20 MED ORDER — DULAGLUTIDE 1.5 MG/0.5ML ~~LOC~~ SOAJ: SUBCUTANEOUS | Status: DC

## 2015-05-20 MED ORDER — EMPAGLIFLOZIN-LINAGLIPTIN 25-5 MG PO TABS: 1.0000 | ORAL_TABLET | Freq: Every day | ORAL | Status: DC

## 2015-05-20 NOTE — Assessment & Plan Note (Signed)
Refilling Trulicity and Glyxambi, losing insurance.

## 2015-05-20 NOTE — Assessment & Plan Note (Addendum)
Awaiting MRI, scheduled for Monday. Declined giving him a handicap placard. Overall symptoms are well-controlled with meloxicam and Flexeril

## 2015-05-20 NOTE — Assessment & Plan Note (Signed)
Losing insurance and needs medications written as generics

## 2015-05-20 NOTE — Progress Notes (Signed)
  Subjective:    CC: Follow-up  HPI: This is a pleasant 60 year old male, he is losing insurance and would like refills on some of his medications.  Hypertension: Well controlled.  Diabetes mellitus type 2: Well-controlled  Lumbar degenerative disc disease: MRI scheduled for Monday, overall pain has become well-controlled on meloxicam and Flexeril.  Past medical history, Surgical history, Family history not pertinant except as noted below, Social history, Allergies, and medications have been entered into the medical record, reviewed, and no changes needed.   Review of Systems: No fevers, chills, night sweats, weight loss, chest pain, or shortness of breath.   Objective:    General: Well Developed, well nourished, and in no acute distress.  Neuro: Alert and oriented x3, extra-ocular muscles intact, sensation grossly intact.  HEENT: Normocephalic, atraumatic, pupils equal round reactive to light, neck supple, no masses, no lymphadenopathy, thyroid nonpalpable.  Skin: Warm and dry, no rashes. Cardiac: Regular rate and rhythm, no murmurs rubs or gallops, no lower extremity edema.  Respiratory: Clear to auscultation bilaterally. Not using accessory muscles, speaking in full sentences.  Impression and Recommendations:    I spent 25 minutes with this patient, greater than 50% was face-to-face time counseling regarding the above diagnoses

## 2015-05-25 ENCOUNTER — Ambulatory Visit (INDEPENDENT_AMBULATORY_CARE_PROVIDER_SITE_OTHER): Payer: BLUE CROSS/BLUE SHIELD

## 2015-05-25 DIAGNOSIS — M5136 Other intervertebral disc degeneration, lumbar region: Secondary | ICD-10-CM | POA: Diagnosis not present

## 2015-06-03 ENCOUNTER — Other Ambulatory Visit: Payer: Self-pay | Admitting: Sports Medicine

## 2015-06-07 ENCOUNTER — Other Ambulatory Visit: Payer: Self-pay | Admitting: Sports Medicine

## 2015-06-08 ENCOUNTER — Ambulatory Visit (INDEPENDENT_AMBULATORY_CARE_PROVIDER_SITE_OTHER): Payer: BLUE CROSS/BLUE SHIELD | Admitting: Sports Medicine

## 2015-06-08 ENCOUNTER — Encounter: Payer: Self-pay | Admitting: Sports Medicine

## 2015-06-08 VITALS — BP 154/81 | HR 92 | Temp 98.0°F | Resp 18 | Wt 223.0 lb

## 2015-06-08 DIAGNOSIS — E119 Type 2 diabetes mellitus without complications: Secondary | ICD-10-CM | POA: Diagnosis not present

## 2015-06-08 DIAGNOSIS — I10 Essential (primary) hypertension: Secondary | ICD-10-CM | POA: Diagnosis not present

## 2015-06-08 LAB — POCT GLYCOSYLATED HEMOGLOBIN (HGB A1C): Hemoglobin A1C: 5.4

## 2015-06-08 MED ORDER — CYCLOBENZAPRINE HCL 10 MG PO TABS: 10.0000 mg | ORAL_TABLET | Freq: Two times a day (BID) | ORAL | Status: DC

## 2015-06-08 MED ORDER — VALSARTAN-HYDROCHLOROTHIAZIDE 320-25 MG PO TABS: 1.0000 | ORAL_TABLET | Freq: Every day | ORAL | Status: DC

## 2015-06-08 NOTE — Progress Notes (Signed)
  Subjective:    CC: Follow-up  HPI: Hypertension: Elevated.  Diabetes mellitus type 2: A1c is improved from 8.9% to 5.4%, compliant with all of his medications.  Myofascial pain syndrome: Doing well with Flexeril, needs a refill.   Past medical history, Surgical history, Family history not pertinant except as noted below, Social history, Allergies, and medications have been entered into the medical record, reviewed, and no changes needed.   Review of Systems: No fevers, chills, night sweats, weight loss, chest pain, or shortness of breath.   Objective:    General: Well Developed, well nourished, and in no acute distress.  Neuro: Alert and oriented x3, extra-ocular muscles intact, sensation grossly intact.  HEENT: Normocephalic, atraumatic, pupils equal round reactive to light, neck supple, no masses, no lymphadenopathy, thyroid nonpalpable.  Skin: Warm and dry, no rashes. Cardiac: Regular rate and rhythm, no murmurs rubs or gallops, no lower extremity edema.  Respiratory: Clear to auscultation bilaterally. Not using accessory muscles, speaking in full sentences.  Impression and Recommendations:

## 2015-06-08 NOTE — Assessment & Plan Note (Signed)
Persistently elevated, switching from valsartan to valsartan/HCTZ.

## 2015-06-08 NOTE — Assessment & Plan Note (Signed)
HbA1c improved from 8.9 to 5.4, no changes.

## 2015-06-11 ENCOUNTER — Ambulatory Visit: Payer: BLUE CROSS/BLUE SHIELD | Admitting: Sports Medicine

## 2015-06-22 ENCOUNTER — Other Ambulatory Visit: Payer: Self-pay | Admitting: Sports Medicine

## 2015-07-01 ENCOUNTER — Other Ambulatory Visit: Payer: Self-pay | Admitting: Sports Medicine

## 2015-07-01 ENCOUNTER — Encounter: Payer: Self-pay | Admitting: Sports Medicine

## 2015-07-02 NOTE — Telephone Encounter (Signed)
Antonio look into this

## 2015-07-04 ENCOUNTER — Encounter: Payer: Self-pay | Admitting: Emergency Medicine

## 2015-07-04 ENCOUNTER — Emergency Department
Admission: EM | Admit: 2015-07-04 | Discharge: 2015-07-04 | Disposition: A | Discharge status: Home / Self Care | Payer: BLUE CROSS/BLUE SHIELD | Source: Home / Self Care | Attending: Family Medicine | Admitting: Family Medicine

## 2015-07-04 DIAGNOSIS — J4521 Mild intermittent asthma with (acute) exacerbation: Secondary | ICD-10-CM

## 2015-07-04 DIAGNOSIS — J069 Acute upper respiratory infection, unspecified: Secondary | ICD-10-CM | POA: Diagnosis not present

## 2015-07-04 MED ORDER — PREDNISONE 20 MG PO TABS: ORAL_TABLET | ORAL | Status: DC

## 2015-07-04 MED ORDER — AMOXICILLIN 875 MG PO TABS: 875.0000 mg | ORAL_TABLET | Freq: Two times a day (BID) | ORAL | Status: DC

## 2015-07-04 MED ORDER — BENZONATATE 100 MG PO CAPS: 100.0000 mg | ORAL_CAPSULE | Freq: Three times a day (TID) | ORAL | Status: DC

## 2015-07-04 MED ORDER — FLUTICASONE PROPIONATE 50 MCG/ACT NA SUSP: 2.0000 | Freq: Every day | NASAL | Status: DC

## 2015-07-04 MED ORDER — ALBUTEROL SULFATE HFA 108 (90 BASE) MCG/ACT IN AERS: 1.0000 | INHALATION_SPRAY | Freq: Four times a day (QID) | RESPIRATORY_TRACT | Status: DC | PRN

## 2015-07-04 NOTE — Discharge Instructions (Signed)
You may take 400-600mg  Ibuprofen (Motrin) every 6-8 hours for fever and pain  Alternate with Tylenol  You may take  Tylenol every 4-6 hours as needed for fever and pain  Follow-up with your primary care provider next week for recheck of symptoms if not improving.  Be sure to drink plenty of fluids and rest, at least 8hrs of sleep a night, preferably more while you are sick. Return urgent care or go to closest ER if you cannot keep down fluids/signs of dehydration, fever not reducing with Tylenol, difficulty breathing/wheezing, stiff neck, worsening condition, or other concerns (see below)    Your symptoms are likely due to a virus such as the common cold, however, if you developing worsening chest congestion with shortness of breath, persistent fever for 3 days, or symptoms not improving in 4-5 days, you may fill the antibiotic- Amoxicillin.  If you do fill the antibiotic,  please take antibiotics as prescribed and be sure to complete entire course even if you start to feel better to ensure infection does not come back.

## 2015-07-04 NOTE — ED Notes (Signed)
Patient presents to [facility] with C/O cough cold and congestion without fever,times4 days. History of asthma.

## 2015-07-04 NOTE — ED Provider Notes (Signed)
CSN: [id number]     Arrival date & time 07/04/15  0904 History   None    Chief Complaint  Patient presents with  . Nasal Congestion   (Consider location/radiation/quality/duration/timing/severity/associated sxs/prior Treatment) HPI  The pt is a 60yo male presenting to [facility] with c/o non-productive moderately intermittent cough with mild nasal congestion and SOB for 4 days.  He reports hx of asthma and believes his symptoms got worse last night when he was around friends' dogs.  He has dogs of his own but noticed last night the scratchiness in his throat and coughing got worse.  He does not currently have an inhaler as his asthma has not flared up in several years. He does take Zyrtec and took Dayquil but no relief.  Denies fever, chills, n/v/d. His wife also has cold-like symptoms but not as bad.   Past Medical History  Diagnosis Date  . Hyperlipidemia   . Hypertension   . Diabetes mellitus without complication (HCC)   . Sleep apnea    History reviewed. No pertinent past surgical history. Family History  Problem Relation Age of Onset  . Hypertension Mother   . Cancer Mother     breast  . Hypertension Father    Social History  Substance Use Topics  . Smoking status: Former Smoker -- 1.00 packs/day for 25 years    Types: Cigarettes    Quit date: 07/25/1990  . Smokeless tobacco: Never Used  . Alcohol Use: No    Review of Systems  Constitutional: Negative for fever and chills.  HENT: Positive for congestion, rhinorrhea, sneezing and voice change. Negative for ear pain, sore throat and trouble swallowing.   Respiratory: Positive for cough, chest tightness and shortness of breath.   Cardiovascular: Negative for chest pain and palpitations.  Gastrointestinal: Negative for nausea, vomiting, abdominal pain and diarrhea.  Musculoskeletal: Negative for myalgias, back pain and arthralgias.  Skin: Negative for rash.    Allergies  Erythromycin and Sulfa antibiotics  Home Medications    Prior to Admission medications   Medication Sig Start Date End Date Taking? Authorizing Provider  acyclovir (ZOVIRAX) 400 MG tablet TAKE 1 TABLET (400 MG TOTAL) BY MOUTH 2 (TWO) TIMES DAILY. 06/04/15   Monica Becton, MD  albuterol (PROVENTIL HFA;VENTOLIN HFA) 108 (90 Base) MCG/ACT inhaler Inhale 1-2 puffs into the lungs every 6 (six) hours as needed for wheezing or shortness of breath. 07/04/15   Junius Finner, PA-C  amLODipine (NORVASC) 10 MG tablet Take 1 tablet (10 mg total) by mouth daily. 05/20/15   Monica Becton, MD  amoxicillin (AMOXIL) 875 MG tablet Take 1 tablet (875 mg total) by mouth 2 (two) times daily. For 10 days 07/04/15   Junius Finner, PA-C  atorvastatin (LIPITOR) 20 MG tablet TAKE 1 TABLET (20 MG TOTAL) BY MOUTH DAILY. 03/19/15   Monica Becton, MD  benzonatate (TESSALON) 100 MG capsule Take 1-2 capsules (100-200 mg total) by mouth every 8 (eight) hours. 07/04/15   Junius Finner, PA-C  carvedilol (COREG) 6.25 MG tablet Take 1 tablet (6.25 mg total) by mouth 2 (two) times daily with a meal. 03/26/15   Monica Becton, MD  cetirizine (ZYRTEC) 10 MG tablet Take 1 tablet (10 mg total) by mouth daily. 03/19/14   Monica Becton, MD  cyclobenzaprine (FLEXERIL) 10 MG tablet Take 1 tablet (10 mg total) by mouth 2 (two) times daily. 06/08/15   Monica Becton, MD  Dulaglutide (TRULICITY) 1.5 MG/0.5ML SOPN 1.5mg  subcutaneous weekly. 05/20/15  Monica Becton, MD  Empagliflozin-Linagliptin (GLYXAMBI) 25-5 MG TABS Take 1 tablet by mouth daily. 05/20/15   Monica Becton, MD  fenofibrate (TRICOR) 145 MG tablet TAKE ONE TABLET BY MOUTH DAILY. 07/01/15   Monica Becton, MD  fluticasone (FLONASE) 50 MCG/ACT nasal spray Place 2 sprays into both nostrils daily. 07/04/15   Junius Finner, PA-C  furosemide (LASIX) 40 MG tablet TAKE 1 TABLET (40 MG TOTAL) BY MOUTH DAILY. 06/22/15   Monica Becton, MD  glipiZIDE (GLUCOTROL) 10 MG tablet TAKE 1  TABLET (10 MG TOTAL) BY MOUTH 2 (TWO) TIMES DAILY BEFORE A MEAL. 05/20/15   Monica Becton, MD  Icosapent Ethyl 1 G CAPS Take 1 capsule by mouth 2 (two) times daily. 08/19/14   Monica Becton, MD  KLOR-CON M20 20 MEQ tablet TAKE 2 TABLETS (40 MEQ TOTAL) BY MOUTH 2 (TWO) TIMES DAILY. 06/04/15   Monica Becton, MD  meloxicam (MOBIC) 15 MG tablet One tab PO qAM with breakfast for 2 weeks, then daily prn pain. 04/30/15   Monica Becton, MD  metFORMIN (GLUCOPHAGE) 1000 MG tablet TAKE 1 TABLET (1,000 MG TOTAL) BY MOUTH 2 (TWO) TIMES DAILY WITH A MEAL. 04/28/15   Monica Becton, MD  omeprazole (PRILOSEC) 20 MG capsule TAKE 1 CAPSULE (20 MG TOTAL) BY MOUTH DAILY. 03/30/15   Monica Becton, MD  predniSONE (DELTASONE) 20 MG tablet 3 tabs po day one, then 2 po daily x 4 days 07/04/15   Junius Finner, PA-C  sildenafil (REVATIO) 20 MG tablet Take 1 tablet (20 mg total) by mouth as needed. 03/19/14   Monica Becton, MD  valsartan-hydrochlorothiazide (DIOVAN-HCT) 320-25 MG tablet Take 1 tablet by mouth daily. 06/08/15   Monica Becton, MD   Meds Ordered and Administered this Visit  Medications - No data to display  BP 142/82 mmHg  Pulse 98  Temp(Src) 97.7 F (36.5 C) (Oral)  Resp 16  Ht 5\' 11"  (1.803 m)  Wt 224 lb 8 oz (101.833 kg)  BMI 31.33 kg/m2  SpO2 97% No data found.   Physical Exam  Constitutional: He appears well-developed and well-nourished.  HENT:  Head: Normocephalic and atraumatic.  Right Ear: Tympanic membrane normal.  Left Ear: Tympanic membrane normal.  Nose: Rhinorrhea present. Right sinus exhibits no maxillary sinus tenderness and no frontal sinus tenderness. Left sinus exhibits no maxillary sinus tenderness and no frontal sinus tenderness.  Mouth/Throat: Uvula is midline, oropharynx is clear and moist and mucous membranes are normal.  Eyes: Conjunctivae are normal. No scleral icterus.  Neck: Normal range of motion. Neck supple.   Cardiovascular: Normal rate, regular rhythm and normal heart sounds.   Pulmonary/Chest: Effort normal. No respiratory distress. He has wheezes. He has no rales.  Abdominal: Soft. He exhibits no distension. There is no tenderness.  Musculoskeletal: Normal range of motion.  Neurological: He is alert.  Skin: Skin is warm and dry.  Nursing note and vitals reviewed.   ED Course  Procedures (including critical care time)  Labs Review Labs Reviewed - No data to display  Imaging Review No results found.    MDM   1. Acute upper respiratory infection   2. Asthma with acute exacerbation, mild intermittent    Pt with hx of asthma c/o URI symptoms for 4 days with worsening cough and SOB.  Mild wheeze on exam but no respiratory distress. O2 Sat 97% on RA  No evidence of bacterial infection at this time.  Will tx symptomatically  for now. Rx: prednisone, tessalon, albuterol, and Flonase.  Prescription to hold for Amoxicillin provided with expiration date. May fill if symptoms persist for 4-5 more days or persistent fever develops.   Advised pt to use acetaminophen and ibuprofen as needed for fever and pain. Encouraged rest and fluids. F/u with PCP in 7-10 days if not improving, sooner if worsening. Pt verbalized understanding and agreement with tx plan.     Junius Finner, PA-C 07/04/15 [phone]

## 2015-07-07 ENCOUNTER — Telehealth: Payer: Self-pay | Admitting: Sports Medicine

## 2015-07-07 DIAGNOSIS — E119 Type 2 diabetes mellitus without complications: Secondary | ICD-10-CM

## 2015-07-07 MED ORDER — DAPAGLIFLOZIN PRO-METFORMIN ER 10-1000 MG PO TB24: 1.0000 | ORAL_TABLET | Freq: Every day | ORAL | Status: DC

## 2015-07-07 NOTE — Telephone Encounter (Signed)
Glyxambi no longer covered, switching to [employer].  Rx sent in, patient needs to stop metformin and get discount coupon.

## 2015-07-08 NOTE — Telephone Encounter (Signed)
Pt notified of new Rx and to stop the Metformin. He will come by and get the savings card.

## 2015-07-09 NOTE — Telephone Encounter (Signed)
XigDuo needs a prior authorization. Not sure if you have received PA.

## 2015-07-12 ENCOUNTER — Encounter: Payer: Self-pay | Admitting: Sports Medicine

## 2015-07-13 NOTE — Telephone Encounter (Signed)
Called Delaney Meigsamara (xigduo rep) and gave her the patient's info. She is working on this and will let us know the outcome.Left message on patients voicmail

## 2015-07-13 NOTE — Telephone Encounter (Signed)
Delaney Meigsamara called back and xigduo has been approved . Left message on patients vm

## 2015-07-20 ENCOUNTER — Encounter: Payer: Self-pay | Admitting: Sports Medicine

## 2015-07-23 ENCOUNTER — Ambulatory Visit (INDEPENDENT_AMBULATORY_CARE_PROVIDER_SITE_OTHER): Payer: BLUE CROSS/BLUE SHIELD | Admitting: Sports Medicine

## 2015-07-23 ENCOUNTER — Encounter: Payer: Self-pay | Admitting: Sports Medicine

## 2015-07-23 VITALS — BP 164/90 | HR 92 | Temp 97.6°F | Resp 18 | Wt 226.5 lb

## 2015-07-23 DIAGNOSIS — J0100 Acute maxillary sinusitis, unspecified: Secondary | ICD-10-CM

## 2015-07-23 DIAGNOSIS — M5136 Other intervertebral disc degeneration, lumbar region: Secondary | ICD-10-CM

## 2015-07-23 DIAGNOSIS — M51369 Other intervertebral disc degeneration, lumbar region without mention of lumbar back pain or lower extremity pain: Secondary | ICD-10-CM

## 2015-07-23 MED ORDER — CYCLOBENZAPRINE HCL 10 MG PO TABS: 10.0000 mg | ORAL_TABLET | Freq: Two times a day (BID) | ORAL | Status: AC

## 2015-07-23 MED ORDER — DICLOFENAC SODIUM 2 % TD SOLN: TRANSDERMAL | Status: DC

## 2015-07-23 MED ORDER — AZITHROMYCIN 250 MG PO TABS: ORAL_TABLET | ORAL | Status: DC

## 2015-07-23 NOTE — Assessment & Plan Note (Signed)
high risk in a diabetic, we will treat with azithromycin start out with.

## 2015-07-23 NOTE — Assessment & Plan Note (Signed)
Refilling Flexeril, adding Pennsaid samples

## 2015-07-23 NOTE — Progress Notes (Signed)
  Subjective:    CC:   sick  HPI: For the past week this pleasant 60 year old male has had sore throat, right ear pressure, pain radiating to the teeth, right ear, no constitutional symptoms, mild cough productive of yellowish  sputum. Symptoms are moderate, persistent.  Lumbar degenerative disc disease: Needs a refill on Flexeril, wonders if a topical NSAID will help.  Past medical history, Surgical history, Family history not pertinant except as noted below, Social history, Allergies, and medications have been entered into the medical record, reviewed, and no changes needed.   Review of Systems: No fevers, chills, night sweats, weight loss, chest pain, or shortness of breath.   Objective:    General: Well Developed, well nourished, and in no acute distress.  Neuro: Alert and oriented x3, extra-ocular muscles intact, sensation grossly intact.  HEENT: Normocephalic, atraumatic, pupils equal round reactive to light, neck supple, no masses, no lymphadenopathy, thyroid nonpalpable.  Oropharynx, nasopharynx, ear canals unremarkable. Skin: Warm and dry, no rashes. Cardiac: Regular rate and rhythm, no murmurs rubs or gallops, no lower extremity edema.  Respiratory: Clear to auscultation bilaterally. Not using accessory muscles, speaking in full sentences.  Impression and Recommendations:    I spent 25 minutes with this patient, greater than 50% was face-to-face time counseling regarding the above diagnoses

## 2015-07-28 ENCOUNTER — Ambulatory Visit (INDEPENDENT_AMBULATORY_CARE_PROVIDER_SITE_OTHER): Payer: BLUE CROSS/BLUE SHIELD | Admitting: Sports Medicine

## 2015-07-28 ENCOUNTER — Encounter: Payer: Self-pay | Admitting: Sports Medicine

## 2015-07-28 ENCOUNTER — Other Ambulatory Visit: Payer: Self-pay | Admitting: Sports Medicine

## 2015-07-28 VITALS — BP 137/85 | HR 102 | Temp 98.5°F | Resp 18 | Wt 221.0 lb

## 2015-07-28 DIAGNOSIS — E785 Hyperlipidemia, unspecified: Secondary | ICD-10-CM

## 2015-07-28 DIAGNOSIS — J0100 Acute maxillary sinusitis, unspecified: Secondary | ICD-10-CM

## 2015-07-28 MED ORDER — OMEGA-3-ACID ETHYL ESTERS 1 G PO CAPS: 2.0000 g | ORAL_CAPSULE | Freq: Two times a day (BID) | ORAL | Status: DC

## 2015-07-28 NOTE — Addendum Note (Signed)
Addended by: Baird KayUGLAS, Ottis Sarnowski M on: 07/28/2015 10:17 AM   Modules accepted: Medications

## 2015-07-28 NOTE — Assessment & Plan Note (Addendum)
Continue TriCor, Lipitor, vascepa was not covered, we may have to switch to Lovaza.  We will proceed with Lovaza

## 2015-07-28 NOTE — Addendum Note (Signed)
Addended by: Monica BectonHEKKEKANDAM, THOMAS J on: 07/28/2015 10:20 AM   Modules accepted: Orders, Medications

## 2015-07-28 NOTE — Progress Notes (Signed)
  Subjective:    CC: follow-up  HPI: This is a pleasant 60 year old male diabetic, we treated him for maxillary sinusitis and acute bronchitis with azithromycin, he improved, yesterday he had a slight worsening of symptoms but is now clear.  Past medical history, Surgical history, Family history not pertinant except as noted below, Social history, Allergies, and medications have been entered into the medical record, reviewed, and no changes needed.   Review of Systems: No fevers, chills, night sweats, weight loss, chest pain, or shortness of breath.   Objective:    General: Well Developed, well nourished, and in no acute distress.  Neuro: Alert and oriented x3, extra-ocular muscles intact, sensation grossly intact.  HEENT: Normocephalic, atraumatic, pupils equal round reactive to light, neck supple, no masses, no lymphadenopathy, thyroid nonpalpable.  Skin: Warm and dry, no rashes. Cardiac: Regular rate and rhythm, no murmurs rubs or gallops, no lower extremity edema.  Respiratory: Clear to auscultation bilaterally. Not using accessory muscles, speaking in full sentences.  Impression and Recommendations:

## 2015-07-28 NOTE — Assessment & Plan Note (Signed)
Slight worsening of symptoms yesterday but overall feeling better today, no cough, sinus pressure has improved significantly. We did already completed a course of azithromycin, no further medications today. Return as needed.

## 2015-07-29 ENCOUNTER — Encounter: Payer: Self-pay | Admitting: Sports Medicine

## 2015-08-04 ENCOUNTER — Ambulatory Visit (INDEPENDENT_AMBULATORY_CARE_PROVIDER_SITE_OTHER): Payer: BLUE CROSS/BLUE SHIELD | Admitting: Sports Medicine

## 2015-08-04 ENCOUNTER — Encounter: Payer: Self-pay | Admitting: Sports Medicine

## 2015-08-04 DIAGNOSIS — J0100 Acute maxillary sinusitis, unspecified: Secondary | ICD-10-CM

## 2015-08-04 MED ORDER — HYDROCOD POLST-CPM POLST ER 10-8 MG/5ML PO SUER: 5.0000 mL | Freq: Two times a day (BID) | ORAL | Status: DC | PRN

## 2015-08-04 MED ORDER — DOXYCYCLINE HYCLATE 100 MG PO TABS: 100.0000 mg | ORAL_TABLET | Freq: Two times a day (BID) | ORAL | Status: AC

## 2015-08-04 MED ORDER — PREDNISONE 50 MG PO TABS: 50.0000 mg | ORAL_TABLET | Freq: Every day | ORAL | Status: DC

## 2015-08-04 NOTE — Assessment & Plan Note (Signed)
Persistent sinus symptoms with cough. Continue Tessalon Perles, we are going to him hard with doxycycline, prednisone, Tussionex.

## 2015-08-04 NOTE — Progress Notes (Signed)
  Subjective:    CC: cough  HPI: I have seen Antonio Burns on and off several times over the past few weeks, we have treated him for his cough and sinus infection with azithromycin, Tessalon Perles. Unfortunately continues to have a cough, he has to get through an event by Saturday and would like me to do everything possible to stop his cough before then. Symptoms are moderate, persistent, shortness of breath, cough is predominant when he takes a deep breath.  Past medical history, Surgical history, Family history not pertinant except as noted below, Social history, Allergies, and medications have been entered into the medical record, reviewed, and no changes needed.   Review of Systems: No fevers, chills, night sweats, weight loss, chest pain, or shortness of breath.   Objective:    General: Well Developed, well nourished, and in no acute distress.  Neuro: Alert and oriented x3, extra-ocular muscles intact, sensation grossly intact.  HEENT: Normocephalic, atraumatic, pupils equal round reactive to light, neck supple, no masses, no lymphadenopathy, thyroid nonpalpable.  Skin: Warm and dry, no rashes. Cardiac: Regular rate and rhythm, no murmurs rubs or gallops, no lower extremity edema.  Respiratory: Clear to auscultation bilaterally. Not using accessory muscles, speaking in full sentences.  Impression and Recommendations:    I spent 25 minutes with this patient, greater than 50% was face-to-face time counseling regarding the above diagnoses

## 2015-09-10 ENCOUNTER — Ambulatory Visit (INDEPENDENT_AMBULATORY_CARE_PROVIDER_SITE_OTHER): Payer: BLUE CROSS/BLUE SHIELD | Admitting: Sports Medicine

## 2015-09-10 ENCOUNTER — Encounter: Payer: Self-pay | Admitting: Sports Medicine

## 2015-09-10 VITALS — BP 157/83 | HR 85 | Resp 18 | Wt 225.8 lb

## 2015-09-10 DIAGNOSIS — M542 Cervicalgia: Secondary | ICD-10-CM | POA: Diagnosis not present

## 2015-09-10 DIAGNOSIS — I10 Essential (primary) hypertension: Secondary | ICD-10-CM | POA: Diagnosis not present

## 2015-09-10 MED ORDER — CARVEDILOL 12.5 MG PO TABS: 12.5000 mg | ORAL_TABLET | Freq: Two times a day (BID) | ORAL | Status: DC

## 2015-09-10 NOTE — Assessment & Plan Note (Signed)
Elevated with a pulse rate in the 80s, increasing carvedilol to 12.5 mg twice a day. Return in 2 weeks, we will make further changes afterwards needed. He did cut back his Lasix to half of the prescribed dose. Checking a BMP

## 2015-09-10 NOTE — Progress Notes (Signed)
  Subjective:    CC: follow-up  HPI: Hypertension: Has been running somewhat elevated over the past several days, having some stress at home, also has cut his Lasix dose in half. Having a bit of a headache but no chest pain, visual changes, shortness of breath.  Neck pain: Mild, localized bilaterally over the paracervical muscles, worse with most movements but nothing radicular, no constitutional symptoms, no trauma.  Past medical history, Surgical history, Family history not pertinant except as noted below, Social history, Allergies, and medications have been entered into the medical record, reviewed, and no changes needed.   Review of Systems: No fevers, chills, night sweats, weight loss, chest pain, or shortness of breath.   Objective:    General: Well Developed, well nourished, and in no acute distress.  Neuro: Alert and oriented x3, extra-ocular muscles intact, sensation grossly intact.  HEENT: Normocephalic, atraumatic, pupils equal round reactive to light, neck supple, no masses, no lymphadenopathy, thyroid nonpalpable.  Skin: Warm and dry, no rashes. Cardiac: Regular rate and rhythm, no murmurs rubs or gallops, no lower extremity edema.  Respiratory: Clear to auscultation bilaterally. Not using accessory muscles, speaking in full sentences. Neck: Negative spurling's Full neck range of motion Grip strength and sensation normal in bilateral hands Strength good C4 to T1 distribution No sensory change to C4 to T1 Reflexes normal  Impression and Recommendations:   I spent 40 minutes with this patient, greater than 50% was face-to-face time counseling regarding the above diagnoses, he wanted to discuss some disappointments when trying to schedule his daughter with us.

## 2015-09-10 NOTE — Assessment & Plan Note (Signed)
Likely muscle spasm, nothing radicular, home rehabilitation exercises given. Avoiding x-rays for now.

## 2015-09-11 LAB — BASIC METABOLIC PANEL
BUN: 24 mg/dL (ref 7–25)
CO2: 27 mmol/L (ref 20–31)
Calcium: 9.4 mg/dL (ref 8.6–10.3)
Glucose, Bld: 214 mg/dL — ABNORMAL HIGH (ref 65–99)

## 2015-09-11 LAB — BASIC METABOLIC PANEL WITH GFR
Chloride: 98 mmol/L (ref 98–110)
Creat: 1.87 mg/dL — ABNORMAL HIGH (ref 0.70–1.25)
Potassium: 3.4 mmol/L — ABNORMAL LOW (ref 3.5–5.3)
Sodium: 136 mmol/L (ref 135–146)

## 2015-09-25 ENCOUNTER — Telehealth: Payer: Self-pay | Admitting: *Deleted

## 2015-09-25 DIAGNOSIS — E119 Type 2 diabetes mellitus without complications: Secondary | ICD-10-CM

## 2015-09-25 NOTE — Telephone Encounter (Signed)
Key: [id number]   PA initiated for Trulicity

## 2015-09-28 ENCOUNTER — Encounter: Payer: Self-pay | Admitting: Sports Medicine

## 2015-09-28 ENCOUNTER — Ambulatory Visit (INDEPENDENT_AMBULATORY_CARE_PROVIDER_SITE_OTHER): Payer: BLUE CROSS/BLUE SHIELD | Admitting: Sports Medicine

## 2015-09-28 ENCOUNTER — Other Ambulatory Visit: Payer: Self-pay | Admitting: Sports Medicine

## 2015-09-28 VITALS — BP 167/89 | HR 77 | Resp 18 | Wt 230.3 lb

## 2015-09-28 DIAGNOSIS — E119 Type 2 diabetes mellitus without complications: Secondary | ICD-10-CM | POA: Diagnosis not present

## 2015-09-28 DIAGNOSIS — N5201 Erectile dysfunction due to arterial insufficiency: Secondary | ICD-10-CM | POA: Diagnosis not present

## 2015-09-28 DIAGNOSIS — I10 Essential (primary) hypertension: Secondary | ICD-10-CM

## 2015-09-28 MED ORDER — HYDRALAZINE HCL 25 MG PO TABS: 25.0000 mg | ORAL_TABLET | Freq: Three times a day (TID) | ORAL | Status: DC

## 2015-09-28 MED ORDER — CARVEDILOL 25 MG PO TABS: 25.0000 mg | ORAL_TABLET | Freq: Two times a day (BID) | ORAL | Status: DC

## 2015-09-28 MED ORDER — SILDENAFIL CITRATE 20 MG PO TABS: 20.0000 mg | ORAL_TABLET | ORAL | Status: DC | PRN

## 2015-09-28 NOTE — Assessment & Plan Note (Signed)
Refilling Viagra. 

## 2015-09-28 NOTE — Assessment & Plan Note (Signed)
Persistently elevated, we did discontinue furosemide due to renal insufficiency. Continue valsartan/HCTZ max dose, amlodipine max dose, increasing carvedilol to 25 mg twice a day. Adding hydralazine. Return in 2 weeks to recheck blood pressure.

## 2015-09-28 NOTE — Assessment & Plan Note (Signed)
Continue current medications, Trulicity has not yet been covered, awaiting on prior authorization, we will switch to bydureon if not covered. I did give him a discount coupon today.

## 2015-09-28 NOTE — Progress Notes (Signed)
  Subjective:    CC: Follow-up  HPI: Hypertension: Elevated with a bit of a headache, we did have to discontinue furosemide due to increasing renal insufficiency. He is only doing 12.5 mg of carvedilol twice a day. No visual changes or chest pain.  Erectile dysfunction: Needs a refill on sildenafil.  Diabetes mellitus type 2: Still awaiting Trulicity approval.  Past medical history, Surgical history, Family history not pertinant except as noted below, Social history, Allergies, and medications have been entered into the medical record, reviewed, and no changes needed.   Review of Systems: No fevers, chills, night sweats, weight loss, chest pain, or shortness of breath.   Objective:    General: Well Developed, well nourished, and in no acute distress.  Neuro: Alert and oriented x3, extra-ocular muscles intact, sensation grossly intact.  HEENT: Normocephalic, atraumatic, pupils equal round reactive to light, neck supple, no masses, no lymphadenopathy, thyroid nonpalpable.  Skin: Warm and dry, no rashes. Cardiac: Regular rate and rhythm, no murmurs rubs or gallops, no lower extremity edema.  Respiratory: Clear to auscultation bilaterally. Not using accessory muscles, speaking in full sentences.  Impression and Recommendations:    I spent 25 minutes with this patient, greater than 50% was face-to-face time counseling regarding the above diagnoses

## 2015-09-29 ENCOUNTER — Other Ambulatory Visit: Payer: Self-pay | Admitting: Sports Medicine

## 2015-09-29 DIAGNOSIS — E119 Type 2 diabetes mellitus without complications: Secondary | ICD-10-CM

## 2015-09-29 MED ORDER — LIRAGLUTIDE 18 MG/3ML ~~LOC~~ SOPN: PEN_INJECTOR | SUBCUTANEOUS | Status: DC

## 2015-09-29 NOTE — Telephone Encounter (Signed)
Left message on patient's vm with Dr. recommendation

## 2015-09-29 NOTE — Telephone Encounter (Signed)
Trulicity has been denied. Insurance wants him to try Victoza. Routing note to provider

## 2015-09-29 NOTE — Telephone Encounter (Signed)
Sending in Victoza.  Please let him know its all they will cover for now and it just means a once a day injection, will probably have better weight loss concurrently with this however when compared to other brands.

## 2015-10-02 ENCOUNTER — Encounter: Payer: Self-pay | Admitting: Sports Medicine

## 2015-10-07 ENCOUNTER — Other Ambulatory Visit: Payer: Self-pay | Admitting: Sports Medicine

## 2015-10-12 ENCOUNTER — Ambulatory Visit (INDEPENDENT_AMBULATORY_CARE_PROVIDER_SITE_OTHER): Payer: BLUE CROSS/BLUE SHIELD | Admitting: Sports Medicine

## 2015-10-12 ENCOUNTER — Encounter: Payer: Self-pay | Admitting: Sports Medicine

## 2015-10-12 VITALS — BP 174/76 | HR 77 | Resp 18 | Wt 230.2 lb

## 2015-10-12 DIAGNOSIS — I10 Essential (primary) hypertension: Secondary | ICD-10-CM | POA: Diagnosis not present

## 2015-10-12 NOTE — Assessment & Plan Note (Signed)
Non-compliant. Needs to take medications as directed.

## 2015-10-12 NOTE — Progress Notes (Signed)
  Subjective:    CC: Follow-up  HPI: Hypertension: Still elevated, has not been taking his medications as prescribed. Minimal headache, no visual changes, chest pain.  Past medical history, Surgical history, Family history not pertinant except as noted below, Social history, Allergies, and medications have been entered into the medical record, reviewed, and no changes needed.   Review of Systems: No fevers, chills, night sweats, weight loss, chest pain, or shortness of breath.   Objective:    General: Well Developed, well nourished, and in no acute distress.  Neuro: Alert and oriented x3, extra-ocular muscles intact, sensation grossly intact.  HEENT: Normocephalic, atraumatic, pupils equal round reactive to light, neck supple, no masses, no lymphadenopathy, thyroid nonpalpable.  Skin: Warm and dry, no rashes. Cardiac: Regular rate and rhythm, no murmurs rubs or gallops, no lower extremity edema.  Respiratory: Clear to auscultation bilaterally. Not using accessory muscles, speaking in full sentences.  Impression and Recommendations:

## 2015-10-26 ENCOUNTER — Ambulatory Visit (INDEPENDENT_AMBULATORY_CARE_PROVIDER_SITE_OTHER): Payer: BLUE CROSS/BLUE SHIELD | Admitting: Sports Medicine

## 2015-10-26 ENCOUNTER — Encounter: Payer: Self-pay | Admitting: Sports Medicine

## 2015-10-26 VITALS — BP 125/74 | HR 96 | Wt 227.0 lb

## 2015-10-26 DIAGNOSIS — E119 Type 2 diabetes mellitus without complications: Secondary | ICD-10-CM

## 2015-10-26 DIAGNOSIS — I10 Essential (primary) hypertension: Secondary | ICD-10-CM

## 2015-10-26 LAB — BASIC METABOLIC PANEL
Chloride: 100 mmol/L (ref 98–110)
Potassium: 3.8 mmol/L (ref 3.5–5.3)
Sodium: 135 mmol/L (ref 135–146)

## 2015-10-26 LAB — BASIC METABOLIC PANEL WITH GFR
BUN: 27 mg/dL — ABNORMAL HIGH (ref 7–25)
CO2: 25 mmol/L (ref 20–31)
Calcium: 9.2 mg/dL (ref 8.6–10.3)
Creat: 1.61 mg/dL — ABNORMAL HIGH (ref 0.70–1.25)
Glucose, Bld: 383 mg/dL — ABNORMAL HIGH (ref 65–99)

## 2015-10-26 LAB — POCT GLYCOSYLATED HEMOGLOBIN (HGB A1C): Hemoglobin A1C: 6.9

## 2015-10-26 NOTE — Assessment & Plan Note (Signed)
Up-to-date on all screening measures, continue current medications. A1c is well controlled.

## 2015-10-26 NOTE — Progress Notes (Signed)
  Subjective:    CC: follow-up  HPI: Hypertension: Well controlled.  Diabetes mellitus type II: Worsened slightly but still controlled.  Past medical history, Surgical history, Family history not pertinant except as noted below, Social history, Allergies, and medications have been entered into the medical record, reviewed, and no changes needed.   Review of Systems: No fevers, chills, night sweats, weight loss, chest pain, or shortness of breath.   Objective:    General: Well Developed, well nourished, and in no acute distress.  Neuro: Alert and oriented x3, extra-ocular muscles intact, sensation grossly intact.  HEENT: Normocephalic, atraumatic, pupils equal round reactive to light, neck supple, no masses, no lymphadenopathy, thyroid nonpalpable.  Skin: Warm and dry, no rashes. Cardiac: Regular rate and rhythm, no murmurs rubs or gallops, no lower extremity edema.  Respiratory: Clear to auscultation bilaterally. Not using accessory muscles, speaking in full sentences.  Impression and Recommendations:   I spent 25 minutes with this patient, greater than 50% was face-to-face time counseling regarding the above diagnoses

## 2015-10-26 NOTE — Addendum Note (Signed)
Addended by: Collie SiadICHARDSON, Enola Siebers M on: 10/26/2015 09:33 AM   Modules accepted: [employer]od[employer]Set

## 2015-10-26 NOTE — Assessment & Plan Note (Signed)
Well-controlled, stay off of Lasix for now. Rechecking renal function.

## 2015-10-28 ENCOUNTER — Ambulatory Visit: Payer: BLUE CROSS/BLUE SHIELD | Admitting: Sports Medicine

## 2015-10-28 ENCOUNTER — Other Ambulatory Visit: Payer: Self-pay | Admitting: Sports Medicine

## 2015-11-02 ENCOUNTER — Encounter: Payer: Self-pay | Admitting: Sports Medicine

## 2015-11-02 DIAGNOSIS — M5136 Other intervertebral disc degeneration, lumbar region: Secondary | ICD-10-CM

## 2015-11-02 DIAGNOSIS — M51369 Other intervertebral disc degeneration, lumbar region without mention of lumbar back pain or lower extremity pain: Secondary | ICD-10-CM

## 2015-11-02 MED ORDER — DICLOFENAC SODIUM 2 % TD SOLN
TRANSDERMAL | Status: AC
Start: 2015-11-02 — End: ?

## 2015-11-04 ENCOUNTER — Other Ambulatory Visit: Payer: Self-pay | Admitting: Sports Medicine

## 2015-11-04 ENCOUNTER — Encounter: Payer: Self-pay | Admitting: Sports Medicine

## 2015-11-13 ENCOUNTER — Encounter: Payer: Self-pay | Admitting: Sports Medicine

## 2015-11-28 ENCOUNTER — Other Ambulatory Visit: Payer: Self-pay | Admitting: Sports Medicine

## 2016-01-06 ENCOUNTER — Other Ambulatory Visit: Payer: Self-pay | Admitting: Sports Medicine

## 2016-01-12 DIAGNOSIS — Z23 Encounter for immunization: Secondary | ICD-10-CM | POA: Diagnosis not present

## 2016-01-14 ENCOUNTER — Telehealth: Payer: Self-pay | Admitting: Sports Medicine

## 2016-01-14 MED ORDER — CANAGLIFLOZIN-METFORMIN HCL ER 150-1000 MG PO TB24: 2.0000 | ORAL_TABLET | Freq: Every day | ORAL | 11 refills | Status: DC

## 2016-01-14 NOTE — Telephone Encounter (Signed)
Completed form faxed by insurance and sent back. Waiting for response.

## 2016-01-14 NOTE — Telephone Encounter (Signed)
Switching to Invokamet XR, he should come and get a discount coupon.

## 2016-01-14 NOTE — Telephone Encounter (Signed)
Left information on Pt's VM, callback provided for any questions.  

## 2016-01-14 NOTE — Telephone Encounter (Signed)
Savings card placed upfront.

## 2016-01-14 NOTE — Telephone Encounter (Signed)
Submitted information via CoverMyMeds for [employer] PA. Key: [id number] - Rx #: [id number]

## 2016-01-14 NOTE — Telephone Encounter (Signed)
PA for Xigduo denied. Must try and fail alternative: Invokamet. Will route to PCP.

## 2016-01-14 NOTE — Addendum Note (Signed)
Addended by: Monica BectonHEKKEKANDAM, Malcomb Gangemi J on: 01/14/2016 01:47 PM   Modules accepted: Orders

## 2016-01-18 ENCOUNTER — Other Ambulatory Visit: Payer: Self-pay | Admitting: Sports Medicine

## 2016-02-01 ENCOUNTER — Other Ambulatory Visit: Payer: Self-pay | Admitting: Sports Medicine

## 2016-02-01 DIAGNOSIS — E119 Type 2 diabetes mellitus without complications: Secondary | ICD-10-CM

## 2016-02-17 ENCOUNTER — Other Ambulatory Visit: Payer: Self-pay | Admitting: Sports Medicine

## 2016-03-11 IMAGING — MR MR LUMBAR SPINE W/O CM
4 of 5 series · 24 of 48 positions shown · non-contrast
Comparison: Radiographs dated 05/12/2014 and CT urogram dated
01/14/2010

CLINICAL DATA: Chronic progressive low back pain.

EXAM:
MRI LUMBAR SPINE WITHOUT CONTRAST
TECHNIQUE: Multiplanar, multisequence MR imaging of the lumbar spine was
performed. No intravenous contrast was administered.

[Series 2: T2 · sagittal · 4.0mm · 0.81mm/px · 6 of 15 slices shown (1 of 2)]
[im 1/15]
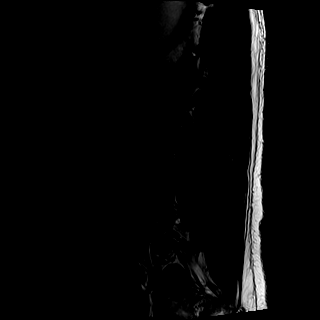
[im 3/15]
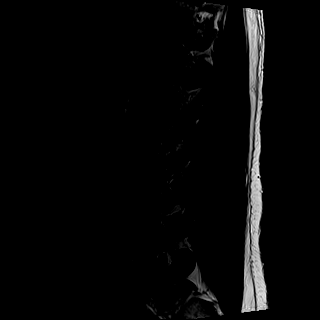
[im 6/15]
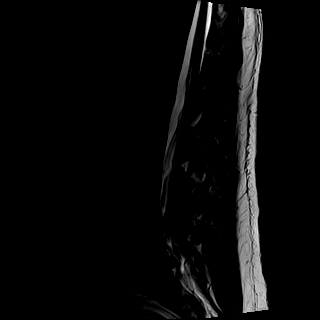
[im 9/15]
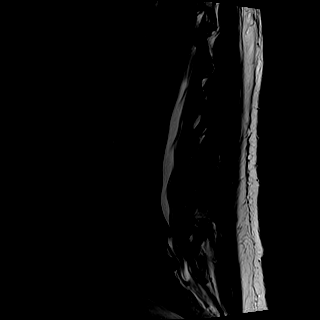
[im 12/15]
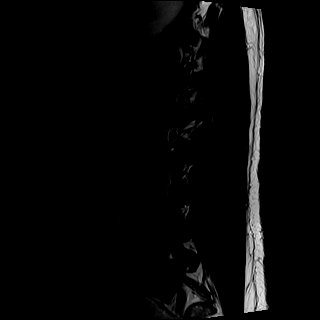
[im 15/15]
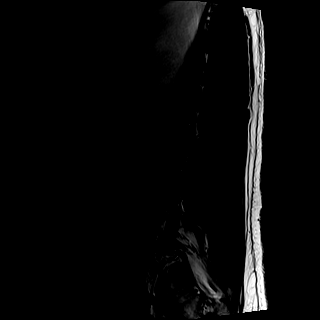

[Series 3: T1 · sagittal · 4.0mm · 0.41mm/px · 6 of 15 slices shown (1 of 2)]
[im 1/15]
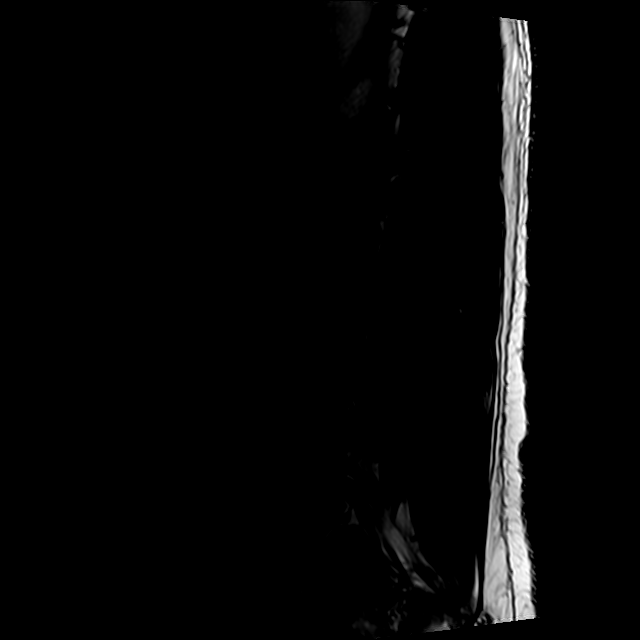
[im 3/15]
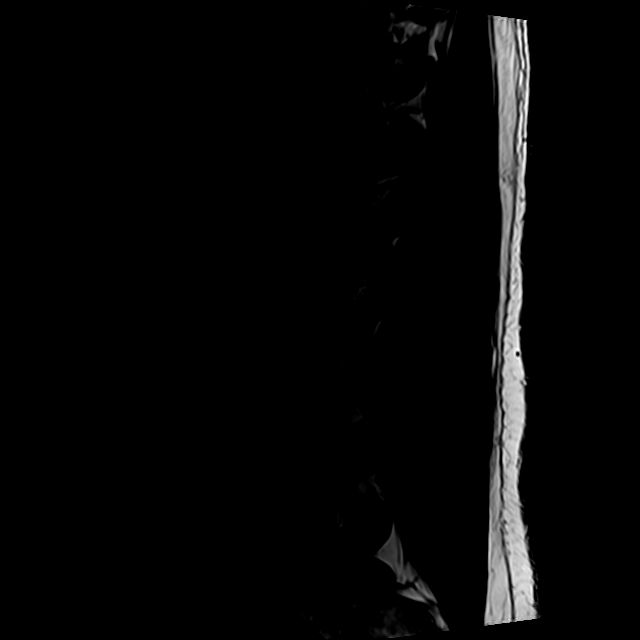
[im 6/15]
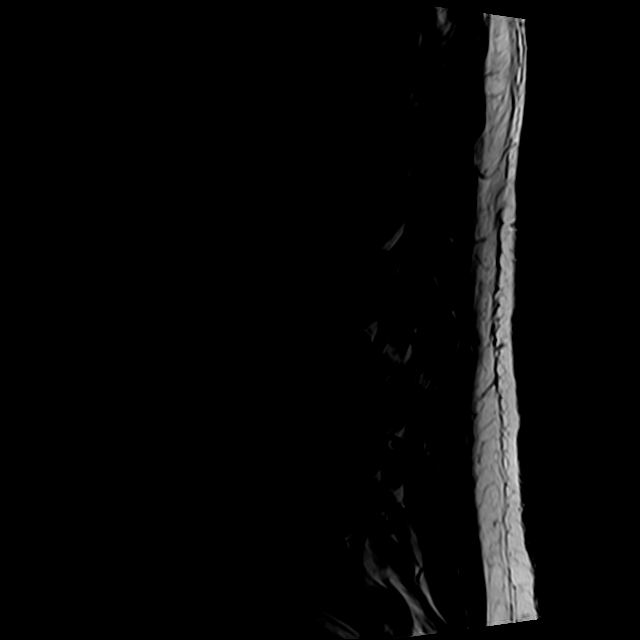
[im 9/15]
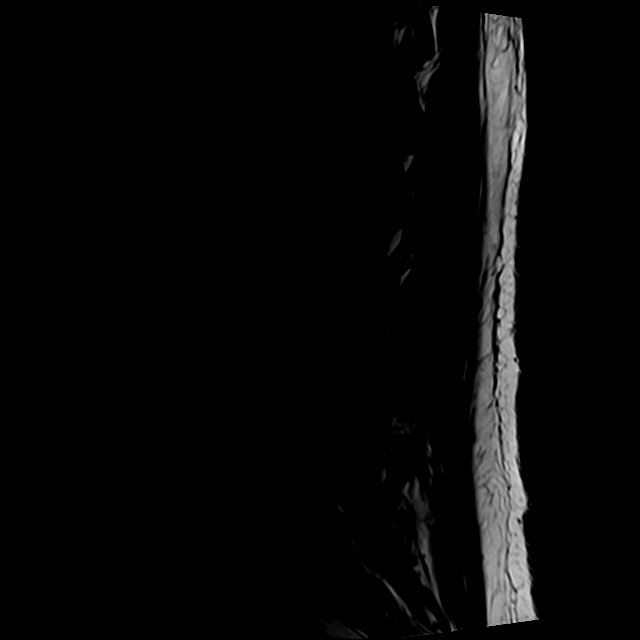
[im 12/15]
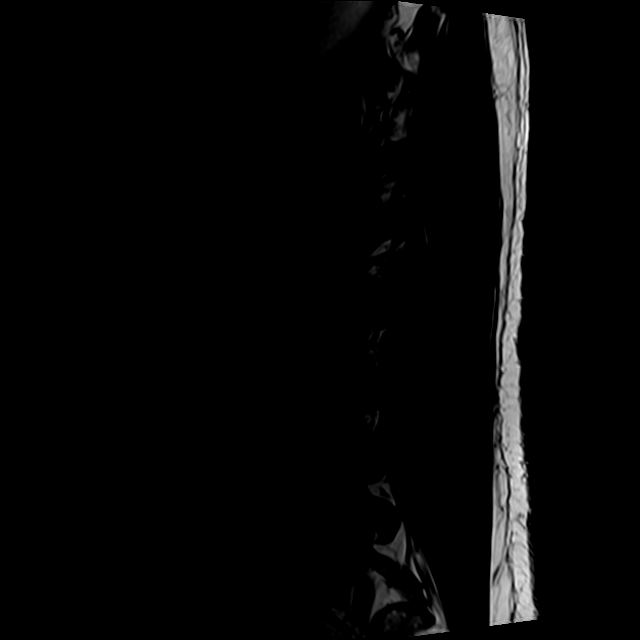
[im 15/15]
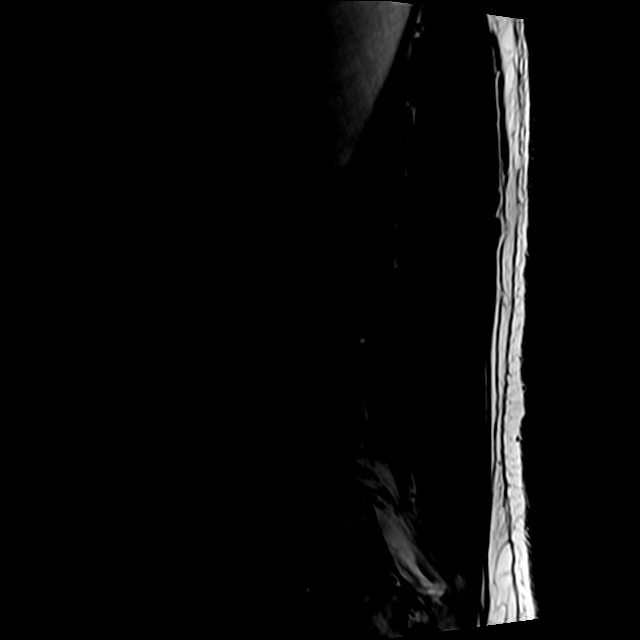

[Series 5: T2 · axial · 4.0mm · 0.78mm/px · z∈[-77,+145]mm · 9 of 39 slices shown (2 of 2)]
[im 1/39]
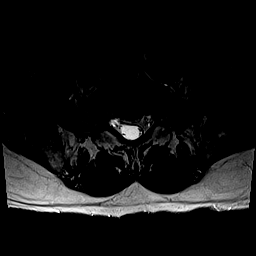
[im 6/39]
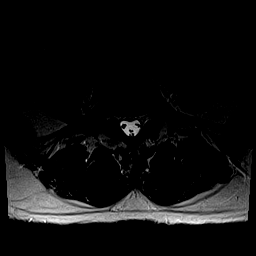
[im 11/39]
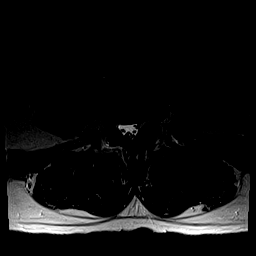
[im 17/39]
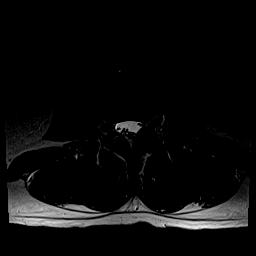
[im 20/39]
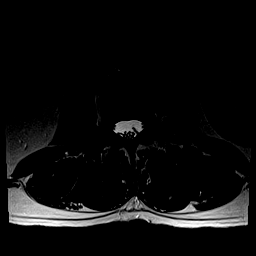
[im 22/39]
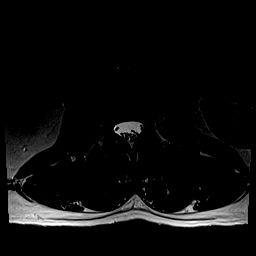
[im 28/39]
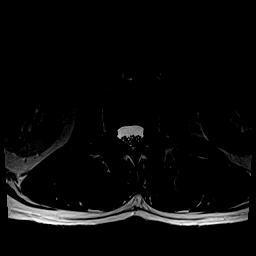
[im 33/39]
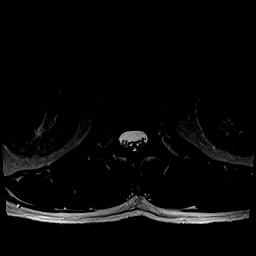
[im 39/39]
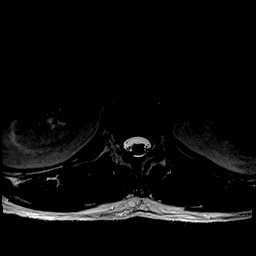

[Series 6: T1 · axial · 4.0mm · 0.31mm/px · z∈[-54,+115]mm · 3 of 39 slices shown (2 of 2)]
[im 6/39]
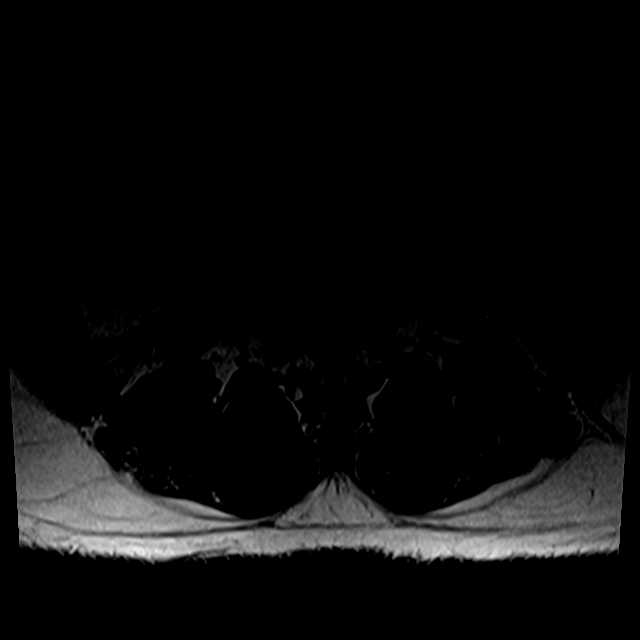
[im 20/39]
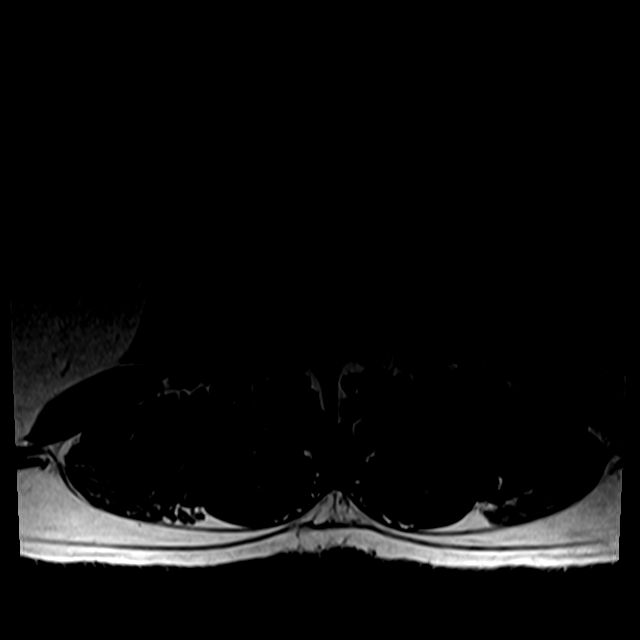
[im 33/39]
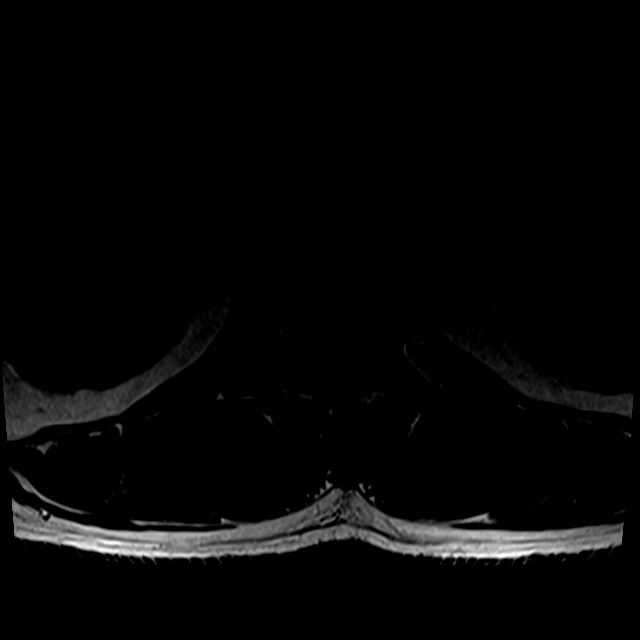

[24 of 48 positions shown; findings below may reference images not displayed]

FINDINGS: Normal conus tip at L1-2.  Normal paraspinal soft tissues.

T11-12 through L3-4:  Normal.

L4-5: Broad-based soft disc protrusion with a small extrusion into
the spinal canal just to the right of midline extending into the
right lateral recess posteriorly deviating the right L5 nerve within
the thecal sac best seen on image 31 of series 5. Slight hypertrophy
of the ligamentum flavum.

L5-S1: Broad-based soft disc protrusion extending foraminal to the
right and left without focal neural impingement. Minimal indentation
upon the thecal sac.
IMPRESSION: 1. Soft disc protrusion and extrusion at L4-5 asymmetric to the
right with a mass effect upon the right L5 nerves within the thecal
sac.
2. Small broad-based soft disc protrusion at L5-S1 without neural
impingement.

## 2016-03-15 ENCOUNTER — Ambulatory Visit (INDEPENDENT_AMBULATORY_CARE_PROVIDER_SITE_OTHER): Payer: BLUE CROSS/BLUE SHIELD | Admitting: Family Medicine

## 2016-03-15 ENCOUNTER — Encounter: Payer: Self-pay | Admitting: Family Medicine

## 2016-03-15 VITALS — BP 113/66 | HR 102 | Temp 98.4°F | Wt 230.0 lb

## 2016-03-15 DIAGNOSIS — E782 Mixed hyperlipidemia: Secondary | ICD-10-CM | POA: Diagnosis not present

## 2016-03-15 DIAGNOSIS — E119 Type 2 diabetes mellitus without complications: Secondary | ICD-10-CM

## 2016-03-15 DIAGNOSIS — I10 Essential (primary) hypertension: Secondary | ICD-10-CM

## 2016-03-15 DIAGNOSIS — J209 Acute bronchitis, unspecified: Secondary | ICD-10-CM

## 2016-03-15 MED ORDER — BENZONATATE 200 MG PO CAPS: 200.0000 mg | ORAL_CAPSULE | Freq: Three times a day (TID) | ORAL | 0 refills | Status: DC | PRN

## 2016-03-15 MED ORDER — IPRATROPIUM BROMIDE 0.06 % NA SOLN: 2.0000 | NASAL | 6 refills | Status: DC | PRN

## 2016-03-15 MED ORDER — GUAIFENESIN-CODEINE 100-10 MG/5ML PO SOLN: 5.0000 mL | Freq: Every evening | ORAL | 0 refills | Status: DC | PRN

## 2016-03-15 MED ORDER — AMOXICILLIN 500 MG PO CAPS: 500.0000 mg | ORAL_CAPSULE | Freq: Three times a day (TID) | ORAL | 0 refills | Status: DC

## 2016-03-15 MED ORDER — PREDNISONE 10 MG PO TABS: 30.0000 mg | ORAL_TABLET | Freq: Every day | ORAL | 0 refills | Status: DC

## 2016-03-15 NOTE — Progress Notes (Signed)
Antonio Burns is a 60 y.o. male who [facility] today for cough congestion runny nose sore throat and hoarse voice. Symptoms present for several days. No fevers or chills. No significant shortness of breath. He denies significant wheezing. He's tried some NyQuil which helps some. Symptoms are moderately bothersome and do interfere with sleep.    Past Medical History:  Diagnosis Date  . Diabetes mellitus without complication (HCC)   . Hyperlipidemia   . Hypertension   . Sleep apnea    No past surgical history on file. Social History  Substance Use Topics  . Smoking status: Former Smoker    Packs/day: 1.00    Years: 25.00    Types: Cigarettes    Quit date: 07/25/1990  . Smokeless tobacco: Never Used  . Alcohol use No   family history includes Cancer in his mother; Hypertension in his father and mother.  ROS as above:  Medications: Current Outpatient Prescriptions  Medication Sig Dispense Refill  . acyclovir (ZOVIRAX) 400 MG tablet TAKE 1 TABLET (400 MG TOTAL) BY MOUTH 2 (TWO) TIMES DAILY. 60 tablet 3  . albuterol (PROVENTIL HFA;VENTOLIN HFA) 108 (90 Base) MCG/ACT inhaler Inhale 1-2 puffs into the lungs every 6 (six) hours as needed for wheezing or shortness of breath. 1 Inhaler 0  . amLODipine (NORVASC) 10 MG tablet Take 1 tablet (10 mg total) by mouth daily. 90 tablet 3  . atorvastatin (LIPITOR) 20 MG tablet TAKE 1 TABLET (20 MG TOTAL) BY MOUTH DAILY. 90 tablet 3  . Canagliflozin-Metformin HCl ER (INVOKAMET XR) [phone] MG TB24 Take 2 tablets by mouth daily. 60 tablet 11  . carvedilol (COREG) 25 MG tablet Take 1 tablet (25 mg total) by mouth 2 (two) times daily with a meal. 60 tablet 11  . cetirizine (ZYRTEC) 10 MG tablet Take 1 tablet (10 mg total) by mouth daily. 90 tablet 3  . cyclobenzaprine (FLEXERIL) 10 MG tablet Take 1 tablet (10 mg  total) by mouth 2 (two) times daily. 180 tablet 3  . Diclofenac Sodium (PENNSAID) 2 % SOLN Apply twice a day 1 Bottle 0  . fenofibrate (TRICOR) 145 MG tablet TAKE ONE TABLET BY MOUTH DAILY. 90 tablet 3  . fluticasone (FLONASE) 50 MCG/ACT nasal spray Place 2 sprays into both nostrils daily. 9.9 g 2  . furosemide (LASIX) 40 MG tablet TAKE 1 TABLET (40 MG TOTAL) BY MOUTH DAILY. 90 tablet 3  . glipiZIDE (GLUCOTROL) 10 MG tablet TAKE 1 TABLET (10 MG TOTAL) BY MOUTH 2 (TWO) TIMES DAILY BEFORE A MEAL. 60 tablet 1  . hydrALAZINE (APRESOLINE) 25 MG tablet TAKE 1 TABLET (25 MG TOTAL) BY MOUTH 3 (THREE) TIMES DAILY. 90 tablet 0  . KLOR-CON M20 20 MEQ tablet TAKE 2 TABLETS (40 MEQ TOTAL) BY MOUTH 2 (TWO) TIMES DAILY. 360 tablet 3  . NOVOFINE 32G X 6 MM MISC USE AS DIRECTED WITH VICTOZA 100 each 0  . omeprazole (PRILOSEC) 20 MG capsule TAKE 1 CAPSULE (20 MG TOTAL) BY MOUTH DAILY. 90 capsule 3  . sildenafil (REVATIO) 20 MG tablet Take 1 tablet (20 mg total) by mouth as needed. 50 tablet 11  . valsartan-hydrochlorothiazide (DIOVAN-HCT) 320-25 MG tablet Take 1 tablet by mouth daily. 90 tablet 3  . VICTOZA 18 MG/3ML SOPN START WITH 0.6 MG SQ DAILYX1WK,THEN INCREASE BY 0.6 MG WEEKLY UNTIL 1.8 MG DAILY 3 pen 3  . amoxicillin (AMOXIL) 500 MG capsule Take 1 capsule (  500 mg total) by mouth 3 (three) times daily. 30 capsule 0  . benzonatate (TESSALON) 200 MG capsule Take 1 capsule (200 mg total) by mouth 3 (three) times daily as needed for cough. 45 capsule 0  . guaiFENesin-codeine 100-10 MG/5ML syrup Take 5 mLs by mouth at bedtime as needed for cough. 120 mL 0  . ipratropium (ATROVENT) 0.06 % nasal spray Place 2 sprays into both nostrils every 4 (four) hours as needed for rhinitis. 10 mL 6  . predniSONE (DELTASONE) 10 MG tablet Take 3 tablets (30 mg total) by mouth daily with breakfast. 15 tablet 0   No current facility-administered medications for this visit.    Allergies  Allergen Reactions  . Erythromycin   .  Sulfa Antibiotics     Health Maintenance Health Maintenance  Topic Date Due  . FOOT EXAM  11/17/2015  . INFLUENZA VACCINE  12/08/2015  . OPHTHALMOLOGY EXAM  12/08/2015  . HEMOGLOBIN A1C  04/26/2016  . COLONOSCOPY  05/09/2018  . PNEUMOCOCCAL POLYSACCHARIDE VACCINE (2) 11/17/2019  . TETANUS/TDAP  05/11/2023  . ZOSTAVAX  Completed  . Hepatitis C Screening  Completed  . HIV Screening  Completed     Exam:  BP 113/66   Pulse (!) 102   Temp 98.4 F (36.9 C) (Oral)   Wt 230 lb (104.3 kg)   SpO2 96%   BMI 32.08 kg/m  Gen: Well NAD HEENT: EOMI,  MMM posterior pharynx with cobblestoning Lungs: Normal work of breathing. CTABL Heart: Mild tachycardia but regular rhythm no MRG Abd: NABS, Soft. Nondistended, Nontender Exts: Brisk capillary refill, warm and well perfused.    No results found for this or any previous visit (from the past 72 hour(s)). No results found.    Assessment and Plan: 60 y.o. male with  Bronchitis. Treat symptomatically with Tessalon Perles Atrovent nasal spray and codeine cough syrup. Use prednisone and amoxicillin for backup if symptoms worsen.  Additionally patient has an upcoming appointment with my partner in one month for a diabetes follow-up. We'll obtain routine fasting blood work before the next visit so that labs are available for the visit with Dr. Karie Schwalbe in December.   Orders Placed This Encounter  Procedures  . CBC  . COMPLETE METABOLIC PANEL WITH GFR  . Hemoglobin A1c  . Lipid panel    Discussed warning signs or symptoms. Please see discharge instructions. Patient expresses understanding.

## 2016-03-15 NOTE — Patient Instructions (Signed)
Thank you for coming in today. Take tessalon and codeine for cough.  Use the nasal spray.  If not better take both prednisone and amoxicillin.   Get fasting labs about 1 week before you see  Dr T next month.   Call or go to the emergency room if you get worse, have trouble breathing, have chest pains, or palpitations.    Acute Bronchitis Bronchitis is inflammation of the airways that extend from the windpipe into the lungs (bronchi). The inflammation often causes mucus to develop. This leads to a cough, which is the most common symptom of bronchitis.  In acute bronchitis, the condition usually develops suddenly and goes away over time, usually in a couple weeks. Smoking, allergies, and asthma can make bronchitis worse. Repeated episodes of bronchitis may cause further lung problems.  CAUSES Acute bronchitis is most often caused by the same virus that causes a cold. The virus can spread from person to person (contagious) through coughing, sneezing, and touching contaminated objects. SIGNS AND SYMPTOMS   Cough.   Fever.   Coughing up mucus.   Body aches.   Chest congestion.   Chills.   Shortness of breath.   Sore throat.  DIAGNOSIS  Acute bronchitis is usually diagnosed through a physical exam. Your health care provider will also ask you questions about your medical history. Tests, such as chest X-rays, are sometimes done to rule out other conditions.  TREATMENT  Acute bronchitis usually goes away in a couple weeks. Oftentimes, no medical treatment is necessary. Medicines are sometimes given for relief of fever or cough. Antibiotic medicines are usually not needed but may be prescribed in certain situations. In some cases, an inhaler may be recommended to help reduce shortness of breath and control the cough. A cool mist vaporizer may also be used to help thin bronchial secretions and make it easier to clear the chest.  HOME CARE INSTRUCTIONS  Get plenty of rest.    Drink enough fluids to keep your urine clear or pale yellow (unless you have a medical condition that requires fluid restriction). Increasing fluids may help thin your respiratory secretions (sputum) and reduce chest congestion, and it will prevent dehydration.   Take medicines only as directed by your health care provider.  If you were prescribed an antibiotic medicine, finish it all even if you start to feel better.  Avoid smoking and secondhand smoke. Exposure to cigarette smoke or irritating chemicals will make bronchitis worse. If you are a smoker, consider using nicotine gum or skin patches to help control withdrawal symptoms. Quitting smoking will help your lungs heal faster.   Reduce the chances of another bout of acute bronchitis by washing your hands frequently, avoiding people with cold symptoms, and trying not to touch your hands to your mouth, nose, or eyes.   Keep all follow-up visits as directed by your health care provider.  SEEK MEDICAL CARE IF: Your symptoms do not improve after 1 week of treatment.  SEEK IMMEDIATE MEDICAL CARE IF:  You develop an increased fever or chills.   You have chest pain.   You have severe shortness of breath.  You have bloody sputum.   You develop dehydration.  You faint or repeatedly feel like you are going to pass out.  You develop repeated vomiting.  You develop a severe headache. MAKE SURE YOU:   Understand these instructions.  Will watch your condition.  Will get help right away if you are not doing well or get worse.  This information is not intended to replace advice given to you by your health care provider. Make sure you discuss any questions you have with your health care provider.   Document Released: 06/02/2004 Document Revised: 05/16/2014 Document Reviewed: 10/16/2012 Elsevier Interactive Patient Education [employer].

## 2016-03-16 ENCOUNTER — Ambulatory Visit: Payer: BLUE CROSS/BLUE SHIELD | Admitting: Sports Medicine

## 2016-03-18 ENCOUNTER — Other Ambulatory Visit: Payer: Self-pay | Admitting: Sports Medicine

## 2016-04-01 ENCOUNTER — Other Ambulatory Visit: Payer: Self-pay | Admitting: Sports Medicine

## 2016-04-14 ENCOUNTER — Other Ambulatory Visit: Payer: Self-pay | Admitting: Sports Medicine

## 2016-04-21 DIAGNOSIS — E119 Type 2 diabetes mellitus without complications: Secondary | ICD-10-CM | POA: Diagnosis not present

## 2016-04-21 DIAGNOSIS — E782 Mixed hyperlipidemia: Secondary | ICD-10-CM | POA: Diagnosis not present

## 2016-04-21 DIAGNOSIS — I10 Essential (primary) hypertension: Secondary | ICD-10-CM | POA: Diagnosis not present

## 2016-04-21 LAB — COMPLETE METABOLIC PANEL WITH GFR
ALBUMIN: 4.4 g/dL (ref 3.6–5.1)
ALK PHOS: 29 U/L — AB (ref 40–115)
ALT: 19 U/L (ref 9–46)
AST: 17 U/L (ref 10–35)
BILIRUBIN TOTAL: 0.8 mg/dL (ref 0.2–1.2)
BUN: 21 mg/dL (ref 7–25)
CALCIUM: 9.5 mg/dL (ref 8.6–10.3)
CO2: 28 mmol/L (ref 20–31)
Chloride: 102 mmol/L (ref 98–110)
Creat: 1.76 mg/dL — ABNORMAL HIGH (ref 0.70–1.25)
GFR, EST AFRICAN AMERICAN: 47 mL/min — AB (ref >=60)
GFR, EST NON AFRICAN AMERICAN: 41 mL/min — AB (ref >=60)
Glucose, Bld: 168 mg/dL — ABNORMAL HIGH (ref 65–99)
POTASSIUM: 3.5 mmol/L (ref 3.5–5.3)
Sodium: 140 mmol/L (ref 135–146)
TOTAL PROTEIN: 7 g/dL (ref 6.1–8.1)

## 2016-04-21 LAB — CBC
HCT: 42.3 % (ref 38.5–50.0)
Hemoglobin: 15 g/dL (ref 13.2–17.1)
MCH: 30.6 pg (ref 27.0–33.0)
MCHC: 35.5 g/dL (ref 32.0–36.0)
MCV: 86.3 fL (ref 80.0–100.0)
MPV: 9.4 fL (ref 7.5–12.5)
Platelets: 131 10*3/uL — ABNORMAL LOW (ref 140–400)
RBC: 4.9 MIL/uL (ref 4.20–5.80)
RDW: 13.9 % (ref 11.0–15.0)
WBC: 5.7 10*3/uL (ref 3.8–10.8)

## 2016-04-21 LAB — LIPID PANEL
CHOLESTEROL: 125 mg/dL (ref <200)
HDL: 25 mg/dL — ABNORMAL LOW (ref >40)
LDL Cholesterol: 48 mg/dL (ref <100)
TRIGLYCERIDES: 262 mg/dL — AB (ref <150)
Total CHOL/HDL Ratio: 5 Ratio — ABNORMAL HIGH (ref <5.0)
VLDL: 52 mg/dL — AB (ref <30)

## 2016-04-21 LAB — HEMOGLOBIN A1C
HEMOGLOBIN A1C: 5.5 % (ref <5.7)
MEAN PLASMA GLUCOSE: 111 mg/dL

## 2016-04-26 ENCOUNTER — Ambulatory Visit (INDEPENDENT_AMBULATORY_CARE_PROVIDER_SITE_OTHER): Payer: BLUE CROSS/BLUE SHIELD | Admitting: Sports Medicine

## 2016-04-26 ENCOUNTER — Encounter: Payer: Self-pay | Admitting: Sports Medicine

## 2016-04-26 DIAGNOSIS — E119 Type 2 diabetes mellitus without complications: Secondary | ICD-10-CM

## 2016-04-26 DIAGNOSIS — I10 Essential (primary) hypertension: Secondary | ICD-10-CM | POA: Diagnosis not present

## 2016-04-26 DIAGNOSIS — E782 Mixed hyperlipidemia: Secondary | ICD-10-CM | POA: Diagnosis not present

## 2016-04-26 DIAGNOSIS — K1370 Unspecified lesions of oral mucosa: Secondary | ICD-10-CM | POA: Insufficient documentation

## 2016-04-26 MED ORDER — FISH OIL 1000 MG PO CAPS: 2000.0000 mg | ORAL_CAPSULE | Freq: Two times a day (BID) | ORAL | 0 refills | Status: DC

## 2016-04-26 MED ORDER — FENOFIBRATE 160 MG PO TABS: 160.0000 mg | ORAL_TABLET | Freq: Every day | ORAL | 3 refills | Status: DC

## 2016-04-26 MED ORDER — EMPAGLIFLOZIN-METFORMIN HCL ER 25-1000 MG PO TB24: 1.0000 | ORAL_TABLET | ORAL | 11 refills | Status: DC

## 2016-04-26 NOTE — Assessment & Plan Note (Signed)
Under beautiful control, [facility] and amputation risk with other SGLT2s.

## 2016-04-26 NOTE — Assessment & Plan Note (Signed)
Well controlled, no changes 

## 2016-04-26 NOTE — Progress Notes (Signed)
  Subjective:    CC: Followup  HPI: Diabetes mellitus type 2: Well-controlled, patient would like to switch from Invokamet.  Hypertension: Well controlled.  Hypertriglyceridemia:  Under moderate control with fenofibrate, Lipitor, fish oil. He declined niacin.  Oral lesion: Located in the hard palate, left-sided.  Past medical history:  Negative.  See flowsheet/record as well for more information.  Surgical history: Negative.  See flowsheet/record as well for more information.  Family history: Negative.  See flowsheet/record as well for more information.  Social history: Negative.  See flowsheet/record as well for more information.  Allergies, and medications have been entered into the medical record, reviewed, and no changes needed.   Review of Systems: No fevers, chills, night sweats, weight loss, chest pain, or shortness of breath.   Objective:    General: Well Developed, well nourished, and in no acute distress.  Neuro: Alert and oriented x3, extra-ocular muscles intact, sensation grossly intact.  HEENT: Normocephalic, atraumatic, pupils equal round reactive to light, neck supple, no masses, no lymphadenopathy, thyroid nonpalpable. There is an exophytic mass on the left side of the hard palate, nasopharynx and ear canals ok. Skin: Warm and dry, no rashes. Cardiac: Regular rate and rhythm, no murmurs rubs or gallops, no lower extremity edema.  Respiratory: Clear to auscultation bilaterally. Not using accessory muscles, speaking in full sentences.  Impression and Recommendations:    Hyperlipidemia Far improved from a triglyceride level of 500 on fenofibrate, atorvastatin, 4000 mg of fish oil per day, he has been intolerant of niacin in the past and Vascepa was not covered. I'm increasing his fenofibrate slightly vomit this should improve his triglyceride numbers somewhat. I think we have maximum improvement with this medically.  Hypertension Well-controlled, no  changes  Diabetes mellitus without complication Under beautiful control, [facility] and amputation risk with other SGLT2s.   Oral lesion There is an exophytic lesion on the left side of the hard palate, referral to ENT.  I spent 25 minutes with this patient, greater than 50% was face-to-face time counseling regarding the above diagnoses

## 2016-04-26 NOTE — Assessment & Plan Note (Signed)
There is an exophytic lesion on the left side of the hard palate, referral to ENT.

## 2016-04-26 NOTE — Assessment & Plan Note (Addendum)
Far improved from a triglyceride level of 500 on fenofibrate, atorvastatin, 4000 mg of fish oil per day, he has been intolerant of niacin in the past and Vascepa was not covered. I'm increasing his fenofibrate slightly vomit this should improve his triglyceride numbers somewhat. I think we have maximum improvement with this medically.

## 2016-05-04 DIAGNOSIS — D1030 Benign neoplasm of unspecified part of mouth: Secondary | ICD-10-CM | POA: Diagnosis not present

## 2016-05-05 ENCOUNTER — Other Ambulatory Visit: Payer: Self-pay | Admitting: Sports Medicine

## 2016-05-12 ENCOUNTER — Other Ambulatory Visit: Payer: Self-pay | Admitting: Sports Medicine

## 2016-06-07 ENCOUNTER — Other Ambulatory Visit: Payer: Self-pay | Admitting: Sports Medicine

## 2016-06-07 DIAGNOSIS — I10 Essential (primary) hypertension: Secondary | ICD-10-CM

## 2016-06-13 ENCOUNTER — Other Ambulatory Visit: Payer: Self-pay | Admitting: Sports Medicine

## 2016-06-14 ENCOUNTER — Other Ambulatory Visit: Payer: Self-pay | Admitting: Sports Medicine

## 2016-06-14 DIAGNOSIS — E119 Type 2 diabetes mellitus without complications: Secondary | ICD-10-CM

## 2016-06-20 ENCOUNTER — Other Ambulatory Visit: Payer: Self-pay | Admitting: Sports Medicine

## 2016-06-21 ENCOUNTER — Other Ambulatory Visit: Payer: Self-pay | Admitting: Sports Medicine

## 2016-06-22 ENCOUNTER — Encounter: Payer: Self-pay | Admitting: Sports Medicine

## 2016-06-22 ENCOUNTER — Other Ambulatory Visit: Payer: Self-pay | Admitting: Sports Medicine

## 2016-06-22 ENCOUNTER — Ambulatory Visit (INDEPENDENT_AMBULATORY_CARE_PROVIDER_SITE_OTHER): Payer: BLUE CROSS/BLUE SHIELD | Admitting: Sports Medicine

## 2016-06-22 DIAGNOSIS — N481 Balanitis: Secondary | ICD-10-CM | POA: Diagnosis not present

## 2016-06-22 DIAGNOSIS — H04301 Unspecified dacryocystitis of right lacrimal passage: Secondary | ICD-10-CM | POA: Diagnosis not present

## 2016-06-22 MED ORDER — DOXYCYCLINE HYCLATE 100 MG PO TABS: 100.0000 mg | ORAL_TABLET | Freq: Two times a day (BID) | ORAL | 0 refills | Status: AC

## 2016-06-22 MED ORDER — CLOTRIMAZOLE-BETAMETHASONE 1-0.05 % EX CREA: 1.0000 "application " | TOPICAL_CREAM | Freq: Two times a day (BID) | CUTANEOUS | 0 refills | Status: DC

## 2016-06-22 NOTE — Progress Notes (Signed)
  Subjective:    CC: Couple of issues  HPI: Eye infection: Present for several days, localized just medial at the nasolacrimal duct opening. Somewhat painful, no visual changes, no constitutional symptoms, moderate, persistent.  Penile rash: Itchy, localized on the undersurface of the shaft distally. No Disch area, no discharge.  Past medical history:  Negative.  See flowsheet/record as well for more information.  Surgical history: Negative.  See flowsheet/record as well for more information.  Family history: Negative.  See flowsheet/record as well for more information.  Social history: Negative.  See flowsheet/record as well for more information.  Allergies, and medications have been entered into the medical record, reviewed, and no changes needed.   Review of Systems: No fevers, chills, night sweats, weight loss, chest pain, or shortness of breath.   Objective:    General: Well Developed, well nourished, and in no acute distress.  Neuro: Alert and oriented x3, extra-ocular muscles intact, sensation grossly intact.  HEENT: Normocephalic, atraumatic, pupils equal round reactive to light, neck supple, no masses, no lymphadenopathy, thyroid nonpalpable. Extraocular muscles intact, there is some redness and swelling with protrusion of the right nasolacrimal duct with tenderness to palpation. Vision grossly normal. Skin: Warm and dry, no rashes. Cardiac: Regular rate and rhythm, no murmurs rubs or gallops, no lower extremity edema.  Respiratory: Clear to auscultation bilaterally. Not using accessory muscles, speaking in full sentences. Penis: Slight erythema at the undersurface of the distal shaft, no discharge. No testicular abnormalities or tenderness.  Impression and Recommendations:    Dacryocystitis, right Adding doxycycline.  Patient will do warm compresses and massage. Referral to ophthalmology.  Balanitis Looks simply like a topical fungal infection. He is on an SGLT2-type  medication. Adding topical Lotrisone for now.

## 2016-06-22 NOTE — Assessment & Plan Note (Signed)
Adding doxycycline.  Patient will do warm compresses and massage. Referral to ophthalmology.

## 2016-06-22 NOTE — Patient Instructions (Signed)
Dacryocystitis  Dacryocystitis is an infection of the tear sac (lacrimal sac). The lacrimal sac lies between the inner corner of the eyelids and the nose. The glands of the eyelids produce tears. This is to keep the surface of the eye wet and protect it. These tears drain from the surface of the eyes through a duct in each lid (lacrimal ducts), then through the lacrimal sac into the nose. The tears are then swallowed.  If the lacrimal sacs become blocked, bacteria begin to buildup. The lacrimal sacs can become infected. Dacryocystitis may be sudden (acute) or long-lasting (chronic). This problem is most common in infants because the tear ducts are not fully developed and clog easily. In that case, infants may have episodes of tearing and infection. However, in most cases, the problem gets better as the infant grows.  CAUSES   The cause is often unknown. Known causes can include:   Malformation of the lacrimal sac.   Injury to the eye.   Eye infection.   Injury or inflammation of the nasal passages.  SYMPTOMS    Usually only one eye is involved.   Excessive tearing and watering from the involved eye.   Tenderness, redness, and swelling of the lower lid near the nose.   A sore, red, inflamed bump on the inner corner of the lower lid.  DIAGNOSIS   A diagnosis is made after an eye exam to see how much blockage is present and if the surface of the eye is also infected. A culture of the fluid from the lacrimal sac may be examined to find if a specific infection is present.  TREATMENT   Treatment depends on:    The person's age.   Whether or not the infection is chronic or acute.   The amount of blockage that is present.  Additional treatment  Sometimes massaging the area (starting from the inside of the eye and gently massaging down toward the nose) will improve the condition, combined with antibiotic eyedrops or ointments. If massaging the area does not work, it may be necessary to probe the ducts and open up  the drainage system. While this is easily done in the office in adults, probing usually has to be done under general anesthesia in infants.   If the blockage cannot be cleared by probing, surgery may be needed under general anesthesia to create a direct opening for tears to flow between the lacrimal sac and the inside of the nose (dacryocystorhinostomy, DCR).  HOME CARE INSTRUCTIONS    Use any antibiotic eyedrops, ointment, or pills as directed by the caregiver. Finish all medicines even if the symptoms start to get better.   Massage the lacrimal sac as directed by the caregiver.  SEEK IMMEDIATE MEDICAL CARE IF:    There is increased pain, swelling, redness, or drainage from the eye.   Muscle aches, chills, or a general sick feeling develop.   A fever or persistent symptoms develop for more than 2-3 days.   The fever and symptoms suddenly get worse.  MAKE SURE YOU:    Understand these instructions.   Will watch your condition.   Will get help right away if you are not doing well or get worse.     This information is not intended to replace advice given to you by your health care provider. Make sure you discuss any questions you have with your health care provider.     Document Released: 04/22/2000 Document Revised: 05/16/2014 Document Reviewed: 03/16/2015  Elsevier Interactive

## 2016-06-22 NOTE — Assessment & Plan Note (Signed)
Looks simply like a topical fungal infection. He is on an SGLT2-type medication. Adding topical Lotrisone for now.

## 2016-06-23 MED ORDER — CLOTRIMAZOLE-BETAMETHASONE 1-0.05 % EX CREA: 1.0000 "application " | TOPICAL_CREAM | Freq: Two times a day (BID) | CUTANEOUS | 0 refills | Status: AC

## 2016-06-27 ENCOUNTER — Encounter: Payer: Self-pay | Admitting: Sports Medicine

## 2016-06-27 LAB — HM DIABETES EYE EXAM

## 2016-07-06 ENCOUNTER — Other Ambulatory Visit: Payer: Self-pay | Admitting: Sports Medicine

## 2016-07-06 DIAGNOSIS — I10 Essential (primary) hypertension: Secondary | ICD-10-CM

## 2016-07-10 ENCOUNTER — Other Ambulatory Visit: Payer: Self-pay | Admitting: Sports Medicine

## 2016-07-13 ENCOUNTER — Other Ambulatory Visit: Payer: Self-pay | Admitting: Sports Medicine

## 2016-07-18 ENCOUNTER — Other Ambulatory Visit: Payer: Self-pay | Admitting: Sports Medicine

## 2016-08-01 ENCOUNTER — Encounter: Payer: Self-pay | Admitting: Sports Medicine

## 2016-08-12 ENCOUNTER — Other Ambulatory Visit: Payer: Self-pay | Admitting: Sports Medicine

## 2016-08-17 ENCOUNTER — Other Ambulatory Visit: Payer: Self-pay | Admitting: Sports Medicine

## 2016-08-18 ENCOUNTER — Other Ambulatory Visit: Payer: Self-pay | Admitting: Sports Medicine

## 2016-09-01 ENCOUNTER — Other Ambulatory Visit: Payer: Self-pay | Admitting: Sports Medicine

## 2016-09-05 ENCOUNTER — Ambulatory Visit (INDEPENDENT_AMBULATORY_CARE_PROVIDER_SITE_OTHER): Payer: BLUE CROSS/BLUE SHIELD | Admitting: Sports Medicine

## 2016-09-05 DIAGNOSIS — E119 Type 2 diabetes mellitus without complications: Secondary | ICD-10-CM

## 2016-09-05 DIAGNOSIS — M25532 Pain in left wrist: Secondary | ICD-10-CM | POA: Insufficient documentation

## 2016-09-05 MED ORDER — CELECOXIB 200 MG PO CAPS: ORAL_CAPSULE | ORAL | 2 refills | Status: DC

## 2016-09-05 MED ORDER — DULAGLUTIDE 1.5 MG/0.5ML ~~LOC~~ SOAJ: 1.5000 mg | SUBCUTANEOUS | 11 refills | Status: DC

## 2016-09-05 NOTE — Assessment & Plan Note (Signed)
Switching back toTrulicity. Continue Synjardy for now. Patient is returning to his old insurance company.

## 2016-09-05 NOTE — Progress Notes (Signed)
  Subjective:    CC: wrist pain  HPI: Antonio Burns is a 61 y.o. Male with a history of HTN and DM who presents with left wrist pain since November. Patient does a lot of lifting for work, after which the pain is worse and he has to lift more with his arms than his wrist. Pain is localized to the posterior side of the radiocarpal joint, and does not radiate. Pt has tried topical pain relievers and some anti-inflammatories such as ibuprofen and Advil to limited relief, as the pain always returns.  Past medical history:  DM, HTN.  See flowsheet/record as well for more information.  Surgical history: Negative.  See flowsheet/record as well for more information.  Family history: Negative.  See flowsheet/record as well for more information.  Social history: Negative.  See flowsheet/record as well for more information.  Allergies, and medications have been entered into the medical record, reviewed, and no changes needed.   Review of Systems: No fevers, chills, night sweats, weight loss, chest pain, or shortness of breath.   Objective:    General: Well Developed, well nourished, and in no acute distress.  Neuro: Alert and oriented x3, extra-ocular muscles intact, sensation grossly intact.  HEENT: Normocephalic, atraumatic, pupils equal round reactive to light, neck supple, no masses, no lymphadenopathy, thyroid nonpalpable.  Skin: Warm and dry, no rashes. Cardiac: Regular rate and rhythm, no murmurs rubs or gallops, no lower extremity edema.  Respiratory: Clear to auscultation bilaterally. Not using accessory muscles, speaking in full sentences. Left Wrist: Inspection normal with no visible erythema or swelling. ROM smooth and normal with good flexion and extension and ulnar/radial deviation that is symmetrical with opposite wrist. TTP localized to radiocarpal joint. Palpation is normal over metacarpals, navicular, lunate, and TFCC; tendons without tenderness/ swelling No snuffbox tenderness. No  tenderness over Canal [place]. Strength 5/5 in all directions without pain. Pain with passive wrist flexion and ulnar deviation    Impression and Recommendations:    Antonio Burns is a 61 y.o. 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 Pt provided with wrist brace. X-rays ordered, Celebrex prescribed.  Switching patient back to Trulicity. Continue Synjardy for now. Patient is returning to his old insurance company.

## 2016-09-05 NOTE — Assessment & Plan Note (Signed)
Likely represent early osteoarthritis. Wrist brace, Celebrex, x-rays.  Return in one month.

## 2016-09-15 ENCOUNTER — Other Ambulatory Visit: Payer: Self-pay | Admitting: Sports Medicine

## 2016-09-26 ENCOUNTER — Telehealth: Payer: Self-pay | Admitting: Sports Medicine

## 2016-09-26 NOTE — Telephone Encounter (Signed)
Received PA for Trulicity sent through cover my meds and received authorization. I will call pharmacy and patient. - CF  Case ID: [id number]  Authorized from 09/26/16 - 09/26/17

## 2016-09-29 ENCOUNTER — Telehealth: Payer: Self-pay | Admitting: *Deleted

## 2016-09-29 NOTE — Telephone Encounter (Signed)
Pre Authorization sent to cover my meds.[id number]  Approved Message from Plan CaseId:[id number];Status:Approved;Review Type:Prior Auth;Coverage Start Date:09/29/2016;Coverage End Date:09/29/2017;   [facility]

## 2016-10-04 ENCOUNTER — Ambulatory Visit: Payer: BLUE CROSS/BLUE SHIELD | Admitting: Sports Medicine

## 2016-10-04 ENCOUNTER — Other Ambulatory Visit: Payer: Self-pay

## 2016-10-04 DIAGNOSIS — M25532 Pain in left wrist: Secondary | ICD-10-CM

## 2016-10-04 MED ORDER — HYDRALAZINE HCL 25 MG PO TABS: ORAL_TABLET | ORAL | 1 refills | Status: DC

## 2016-10-04 MED ORDER — GLIPIZIDE 10 MG PO TABS: ORAL_TABLET | ORAL | 1 refills | Status: DC

## 2016-10-04 MED ORDER — ACYCLOVIR 400 MG PO TABS: ORAL_TABLET | ORAL | 1 refills | Status: DC

## 2016-10-04 MED ORDER — CELECOXIB 200 MG PO CAPS: ORAL_CAPSULE | ORAL | 2 refills | Status: DC

## 2016-10-10 ENCOUNTER — Other Ambulatory Visit: Payer: Self-pay | Admitting: Sports Medicine

## 2016-10-10 DIAGNOSIS — M25532 Pain in left wrist: Secondary | ICD-10-CM

## 2016-10-11 ENCOUNTER — Other Ambulatory Visit: Payer: Self-pay

## 2016-10-11 MED ORDER — ACYCLOVIR 400 MG PO TABS: ORAL_TABLET | ORAL | 1 refills | Status: DC

## 2016-10-11 MED ORDER — HYDRALAZINE HCL 25 MG PO TABS: ORAL_TABLET | ORAL | 1 refills | Status: DC

## 2016-10-11 MED ORDER — GLIPIZIDE 10 MG PO TABS: ORAL_TABLET | ORAL | 1 refills | Status: DC

## 2016-10-12 ENCOUNTER — Telehealth: Payer: Self-pay | Admitting: *Deleted

## 2016-10-12 NOTE — Telephone Encounter (Signed)
Celebrex has been approved. Approvedtoday  CaseId:[id number];Status:Approved;Review Type:Prior Auth;Coverage Start Date:10/12/2016;Coverage End Date:10/12/2017;  left message on pharm vm

## 2016-10-23 ENCOUNTER — Other Ambulatory Visit: Payer: Self-pay | Admitting: Sports Medicine

## 2016-10-26 ENCOUNTER — Encounter: Payer: Self-pay | Admitting: Sports Medicine

## 2016-10-26 ENCOUNTER — Ambulatory Visit (INDEPENDENT_AMBULATORY_CARE_PROVIDER_SITE_OTHER): Payer: BLUE CROSS/BLUE SHIELD | Admitting: Sports Medicine

## 2016-10-26 ENCOUNTER — Ambulatory Visit (INDEPENDENT_AMBULATORY_CARE_PROVIDER_SITE_OTHER): Payer: BLUE CROSS/BLUE SHIELD

## 2016-10-26 ENCOUNTER — Telehealth: Payer: Self-pay | Admitting: *Deleted

## 2016-10-26 DIAGNOSIS — G8929 Other chronic pain: Secondary | ICD-10-CM

## 2016-10-26 DIAGNOSIS — M25532 Pain in left wrist: Secondary | ICD-10-CM

## 2016-10-26 DIAGNOSIS — E782 Mixed hyperlipidemia: Secondary | ICD-10-CM | POA: Diagnosis not present

## 2016-10-26 DIAGNOSIS — E119 Type 2 diabetes mellitus without complications: Secondary | ICD-10-CM | POA: Diagnosis not present

## 2016-10-26 LAB — POCT GLYCOSYLATED HEMOGLOBIN (HGB A1C): Hemoglobin A1C: 8.5

## 2016-10-26 MED ORDER — FENOFIBRATE 145 MG PO TABS: 145.0000 mg | ORAL_TABLET | Freq: Every day | ORAL | 3 refills | Status: DC

## 2016-10-26 MED ORDER — ICOSAPENT ETHYL 1 G PO CAPS: 2.0000 g | ORAL_CAPSULE | Freq: Two times a day (BID) | ORAL | 11 refills | Status: DC

## 2016-10-26 NOTE — Telephone Encounter (Signed)
Pre Authorization sent to cover my meds. Al Decantjames Seth Key: [id number] - PA Case ID: [id number] Need help? Call us at [phone]  Outcome  Approvedtoday  CaseId:[id number];Status:Approved;Review Type:Prior Auth;Coverage Start Date:10/26/2016;Coverage End Date:10/26/2017; Pharmacy notified

## 2016-10-26 NOTE — Assessment & Plan Note (Signed)
Never got his x-rays, he is getting up and today, I'm going to inject his radiocarpal joint. Return in one month, MRI if no better looking for carpal AVN.

## 2016-10-26 NOTE — Addendum Note (Signed)
Addended by: Baird KayUGLAS, Ova Gillentine M on: 10/26/2016 04:17 PM   Modules accepted: Orders

## 2016-10-26 NOTE — Assessment & Plan Note (Addendum)
Persistent hypertriglyceridemia, triglyceride levels have improved from 500 with fenofibrate and atorvastatin. Declines using niacin. I'm going to switch him from generic Fish oil to actual Vascepa and recheck in 3 months. Vascepa should be covered, he did have a triglyceride level of over 500.

## 2016-10-26 NOTE — Progress Notes (Signed)
Subjective:    CC: Multiple issues  HPI: Diabetes mellitus type 2: Hemoglobin A1c is over 8% today, was 5.5% last time, he did just restart his Trulicity and Synjardy.  Hyperlipidemia: Triglycerides have been moderately controlled, they have been elevated over 500 but overall doing better on fenofibrate, atorvastatin, Lovaza.  He now has new insurance and is agreeable to try Vascepa considering triglycerides over 500.  Left wrist pain: Never got his x-rays but pain was suspected to be osteoarthritis, localized over the dorsal radiocarpal joint without radiation, worse with terminal extension and flexion of the wrist as well as gripping. There is significant gelling.  Past medical history:  Negative.  See flowsheet/record as well for more information.  Surgical history: Negative.  See flowsheet/record as well for more information.  Family history: Negative.  See flowsheet/record as well for more information.  Social history: Negative.  See flowsheet/record as well for more information.  Allergies, and medications have been entered into the medical record, reviewed, and no changes needed.   Review of Systems: No fevers, chills, night sweats, weight loss, chest pain, or shortness of breath.   Objective:    General: Well Developed, well nourished, and in no acute distress.  Neuro: Alert and oriented x3, extra-ocular muscles intact, sensation grossly intact.  HEENT: Normocephalic, atraumatic, pupils equal round reactive to light, neck supple, no masses, no lymphadenopathy, thyroid nonpalpable.  Skin: Warm and dry, no rashes. Cardiac: Regular rate and rhythm, no murmurs rubs or gallops, no lower extremity edema.  Respiratory: Clear to auscultation bilaterally. Not using accessory muscles, speaking in full sentences. Left Wrist: Inspection normal with no visible erythema or swelling. ROM smooth and normal with good flexion and extension and ulnar/radial deviation that is symmetrical with  opposite wrist. Minimally swollen, tender to palpation over the radiocarpal joint and the metacarpal joint dorsally with reproduction of pain with terminal flexion and extension of the wrist passively. No snuffbox tenderness. No tenderness over Canal of [place]. Strength 5/5 in all directions without pain. Negative tinel's and phalens signs. Negative Finkelstein sign. Negative Watson's test.  Procedure: Real-time Ultrasound Guided Injection of left radiocarpal joint and left mid carpal joint Device: GE Logiq E  Verbal informed consent obtained.  Time-out conducted.  Noted no overlying erythema, induration, or other signs of local infection.  Skin prepped in a sterile fashion.  Local anesthesia: Topical Ethyl chloride.  With sterile technique and under real time ultrasound guidance:  Noted osteoarthritis with osteophytes, as well as small effusions in the radiocarpal joint as well as the mid carpal joint, using a single needle puncture and a transverse approach I guided the needle into the radiocarpal joint and injected 1/2 mL kenalog 40, 1/2 mL lidocaine, needle then redirected to the mid carpal joint and I injected an additional 1/2 mL kenalog 40, 1/2 mL lidocaine. Completed without difficulty  Pain immediately resolved suggesting accurate placement of the medication.  Advised to call if fevers/chills, erythema, induration, drainage, or persistent bleeding.  Images permanently stored and available for review in the ultrasound unit.  Impression: Technically successful ultrasound guided injection.  Impression and Recommendations:    Diabetes mellitus without complication Hemoglobin A1c is elevated today. He tells me he has only been on [place] for 2 weeks now. We will recheck his numbers in 3 months. Continue glipizide.  Hyperlipidemia Persistent hypertriglyceridemia, triglyceride levels have improved from 500 with fenofibrate and atorvastatin. Declines using niacin. I'm  going to switch him from generic Fish oil to actual Vascepa  and recheck in 3 months. Vascepa should be covered, he did have a triglyceride level of over 500.  Left wrist pain Never got his x-rays, he is getting up and today, I'm going to inject his radiocarpal joint. Return in one month, MRI if no better looking for carpal AVN.

## 2016-10-26 NOTE — Assessment & Plan Note (Signed)
Hemoglobin A1c is elevated today. He tells me he has only been on [place]rulicity and Synjardy for 2 weeks now. We will recheck his numbers in 3 months. Continue glipizide.

## 2016-10-27 ENCOUNTER — Other Ambulatory Visit: Payer: Self-pay | Admitting: Sports Medicine

## 2016-10-27 DIAGNOSIS — I10 Essential (primary) hypertension: Secondary | ICD-10-CM

## 2016-11-04 ENCOUNTER — Telehealth: Payer: Self-pay | Admitting: Sports Medicine

## 2016-11-04 NOTE — Telephone Encounter (Signed)
[facility] and they have approved coverage on Vascepa from 10/26/16 - 10/26/17. I will contact patient's pharmacy to let them know. - CF

## 2016-11-14 ENCOUNTER — Other Ambulatory Visit: Payer: Self-pay | Admitting: Sports Medicine

## 2016-11-18 ENCOUNTER — Other Ambulatory Visit: Payer: Self-pay

## 2016-11-18 ENCOUNTER — Encounter: Payer: Self-pay | Admitting: Sports Medicine

## 2016-11-18 DIAGNOSIS — E782 Mixed hyperlipidemia: Secondary | ICD-10-CM

## 2016-11-18 DIAGNOSIS — I10 Essential (primary) hypertension: Secondary | ICD-10-CM

## 2016-11-18 DIAGNOSIS — E119 Type 2 diabetes mellitus without complications: Secondary | ICD-10-CM

## 2016-11-18 MED ORDER — AMLODIPINE BESYLATE 10 MG PO TABS: 10.0000 mg | ORAL_TABLET | Freq: Every day | ORAL | 2 refills | Status: DC

## 2016-11-18 MED ORDER — HYDRALAZINE HCL 25 MG PO TABS: ORAL_TABLET | ORAL | 1 refills | Status: DC

## 2016-11-18 MED ORDER — FENOFIBRATE 145 MG PO TABS: 145.0000 mg | ORAL_TABLET | Freq: Every day | ORAL | 3 refills | Status: DC

## 2016-11-18 MED ORDER — POTASSIUM CHLORIDE CRYS ER 20 MEQ PO TBCR: EXTENDED_RELEASE_TABLET | ORAL | 3 refills | Status: DC

## 2016-11-18 MED ORDER — ATORVASTATIN CALCIUM 20 MG PO TABS: 20.0000 mg | ORAL_TABLET | Freq: Every day | ORAL | 0 refills | Status: DC

## 2016-11-18 MED ORDER — GLIPIZIDE 10 MG PO TABS: ORAL_TABLET | ORAL | 1 refills | Status: DC

## 2016-11-18 MED ORDER — ICOSAPENT ETHYL 1 G PO CAPS: 2.0000 g | ORAL_CAPSULE | Freq: Two times a day (BID) | ORAL | 11 refills | Status: DC

## 2016-11-18 MED ORDER — DULAGLUTIDE 1.5 MG/0.5ML ~~LOC~~ SOAJ: 1.5000 mg | SUBCUTANEOUS | 11 refills | Status: DC

## 2016-11-18 MED ORDER — ACYCLOVIR 400 MG PO TABS: ORAL_TABLET | ORAL | 1 refills | Status: DC

## 2016-11-18 MED ORDER — CARVEDILOL 25 MG PO TABS: 25.0000 mg | ORAL_TABLET | Freq: Two times a day (BID) | ORAL | 11 refills | Status: DC

## 2016-11-18 MED ORDER — EMPAGLIFLOZIN-METFORMIN HCL ER 25-1000 MG PO TB24: 1.0000 | ORAL_TABLET | Freq: Every day | ORAL | 11 refills | Status: DC

## 2016-11-18 MED ORDER — OMEPRAZOLE 20 MG PO CPDR: DELAYED_RELEASE_CAPSULE | ORAL | 0 refills | Status: DC

## 2016-11-18 NOTE — Telephone Encounter (Signed)
Refill request for new refills sent to [facility]. Rhonda Cunningham,CMA

## 2016-11-23 ENCOUNTER — Ambulatory Visit (INDEPENDENT_AMBULATORY_CARE_PROVIDER_SITE_OTHER): Payer: BLUE CROSS/BLUE SHIELD | Admitting: Sports Medicine

## 2016-11-23 ENCOUNTER — Other Ambulatory Visit: Payer: Self-pay | Admitting: Sports Medicine

## 2016-11-23 VITALS — BP 116/67 | HR 98 | Wt 220.0 lb

## 2016-11-23 DIAGNOSIS — N5201 Erectile dysfunction due to arterial insufficiency: Secondary | ICD-10-CM | POA: Diagnosis not present

## 2016-11-23 DIAGNOSIS — Z23 Encounter for immunization: Secondary | ICD-10-CM | POA: Diagnosis not present

## 2016-11-23 DIAGNOSIS — Z0000 Encounter for general adult medical examination without abnormal findings: Secondary | ICD-10-CM

## 2016-11-23 DIAGNOSIS — M25532 Pain in left wrist: Secondary | ICD-10-CM | POA: Diagnosis not present

## 2016-11-23 MED ORDER — SILDENAFIL CITRATE 20 MG PO TABS: 20.0000 mg | ORAL_TABLET | ORAL | 11 refills | Status: AC | PRN

## 2016-11-23 NOTE — Assessment & Plan Note (Signed)
Refilling sildenafil. 

## 2016-11-23 NOTE — Assessment & Plan Note (Signed)
Radiocarpal and midcarpal injections at the last visit have provided greater than 70% relief. He will apply topical diclofenac, and return as needed for this. 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

## 2016-11-23 NOTE — Assessment & Plan Note (Signed)
Pneumococcal 13 today

## 2016-11-23 NOTE — Addendum Note (Signed)
Addended by: Chalmers CaterUTTLE, Araiyah Cumpton H on: 11/23/2016 10:13 AM   Modules accepted: Orders

## 2016-11-23 NOTE — Progress Notes (Signed)
  Subjective:    CC: Follow-up  HPI: Preventive measures: Due for pneumococcal 13.  Left wrist pain: A month ago I placed medication in the radiocarpal joint and the metacarpal joint, he had a hell of a day the next day, and then pain improved. He is about 70% better right now, he is agreeable to use topical diclofenac, and it's not hurting enough where it limits any activities of daily living.  Erectile dysfunction: Needs a refill on generic Viagra.  Past medical history:  Negative.  See flowsheet/record as well for more information.  Surgical history: Negative.  See flowsheet/record as well for more information.  Family history: Negative.  See flowsheet/record as well for more information.  Social history: Negative.  See flowsheet/record as well for more information.  Allergies, and medications have been entered into the medical record, reviewed, and no changes needed.   Review of Systems: No fevers, chills, night sweats, weight loss, chest pain, or shortness of breath.   Objective:    General: Well Developed, well nourished, and in no acute distress.  Neuro: Alert and oriented x3, extra-ocular muscles intact, sensation grossly intact.  HEENT: Normocephalic, atraumatic, pupils equal round reactive to light, neck supple, no masses, no lymphadenopathy, thyroid nonpalpable.  Skin: Warm and dry, no rashes. Cardiac: Regular rate and rhythm, no murmurs rubs or gallops, no lower extremity edema.  Respiratory: Clear to auscultation bilaterally. Not using accessory muscles, speaking in full sentences. Left Wrist: Inspection normal with no visible erythema or swelling. ROM smooth and normal with good flexion and extension and ulnar/radial deviation that is symmetrical with opposite wrist. Palpation is normal over metacarpals, navicular, lunate, and TFCC; tendons without tenderness/ swelling No snuffbox tenderness. No tenderness over Canal of [place]. Strength 5/5 in all directions without  pain. Negative tinel's and phalens signs. Negative Finkelstein sign. Negative Watson's test.  Impression and Recommendations:    Left wrist pain Radiocarpal and midcarpal injections at the last visit have provided greater than 70% relief. He will apply topical diclofenac, and return as needed for this. 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   Annual physical exam Pneumococcal 13 today  Erectile dysfunction Refilling sildenafil.  I spent 25 minutes with this patient, greater than 50% was face-to-face time counseling regarding the above diagnoses

## 2016-11-30 ENCOUNTER — Other Ambulatory Visit: Payer: Self-pay | Admitting: Sports Medicine

## 2016-11-30 DIAGNOSIS — I10 Essential (primary) hypertension: Secondary | ICD-10-CM

## 2016-11-30 MED ORDER — VALSARTAN-HYDROCHLOROTHIAZIDE 320-25 MG PO TABS: 1.0000 | ORAL_TABLET | Freq: Every day | ORAL | 3 refills | Status: DC

## 2016-12-05 ENCOUNTER — Encounter: Payer: Self-pay | Admitting: Sports Medicine

## 2016-12-05 ENCOUNTER — Other Ambulatory Visit: Payer: Self-pay

## 2016-12-05 NOTE — Progress Notes (Signed)
Updated Pt's chart to reflect [facility] as primary per Pt request.

## 2016-12-07 ENCOUNTER — Other Ambulatory Visit: Payer: Self-pay | Admitting: Sports Medicine

## 2016-12-16 DIAGNOSIS — D225 Melanocytic nevi of trunk: Secondary | ICD-10-CM | POA: Diagnosis not present

## 2016-12-16 DIAGNOSIS — D2271 Melanocytic nevi of right lower limb, including hip: Secondary | ICD-10-CM | POA: Diagnosis not present

## 2016-12-16 DIAGNOSIS — L570 Actinic keratosis: Secondary | ICD-10-CM | POA: Diagnosis not present

## 2016-12-16 DIAGNOSIS — L821 Other seborrheic keratosis: Secondary | ICD-10-CM | POA: Diagnosis not present

## 2016-12-17 ENCOUNTER — Other Ambulatory Visit: Payer: Self-pay | Admitting: Sports Medicine

## 2016-12-18 ENCOUNTER — Other Ambulatory Visit: Payer: Self-pay | Admitting: Sports Medicine

## 2016-12-19 ENCOUNTER — Encounter: Payer: Self-pay | Admitting: Sports Medicine

## 2016-12-20 ENCOUNTER — Other Ambulatory Visit: Payer: Self-pay | Admitting: Sports Medicine

## 2017-01-13 ENCOUNTER — Encounter: Payer: Self-pay | Admitting: Sports Medicine

## 2017-01-13 DIAGNOSIS — E782 Mixed hyperlipidemia: Secondary | ICD-10-CM

## 2017-01-13 DIAGNOSIS — E119 Type 2 diabetes mellitus without complications: Secondary | ICD-10-CM

## 2017-01-13 MED ORDER — GLIPIZIDE 10 MG PO TABS: ORAL_TABLET | ORAL | 1 refills | Status: DC

## 2017-01-13 MED ORDER — OMEGA-3-ACID ETHYL ESTERS 1 G PO CAPS: 2.0000 g | ORAL_CAPSULE | Freq: Two times a day (BID) | ORAL | 3 refills | Status: DC

## 2017-01-13 MED ORDER — METFORMIN HCL 1000 MG PO TABS: 1000.0000 mg | ORAL_TABLET | Freq: Two times a day (BID) | ORAL | 3 refills | Status: DC

## 2017-01-13 NOTE — Assessment & Plan Note (Signed)
No longer able to afford Vascepa. Switching to Lovaza.

## 2017-01-13 NOTE — Assessment & Plan Note (Signed)
Patient no longer has insurance and needs to be switched to cheaper medications. Historically has been on Trulicity, Synjardy, and Glipizide. We will do his for 3 months, recheck hemoglobin A1c, we will likely need to add insulin.

## 2017-01-15 ENCOUNTER — Other Ambulatory Visit: Payer: Self-pay | Admitting: Sports Medicine

## 2017-01-18 ENCOUNTER — Encounter: Payer: Self-pay | Admitting: Sports Medicine

## 2017-01-25 ENCOUNTER — Ambulatory Visit: Payer: BLUE CROSS/BLUE SHIELD | Admitting: Sports Medicine

## 2017-02-08 ENCOUNTER — Encounter: Payer: Self-pay | Admitting: Sports Medicine

## 2017-02-10 ENCOUNTER — Other Ambulatory Visit: Payer: Self-pay | Admitting: Sports Medicine

## 2017-02-16 ENCOUNTER — Other Ambulatory Visit: Payer: Self-pay | Admitting: Sports Medicine

## 2017-02-22 ENCOUNTER — Encounter: Payer: Self-pay | Admitting: Sports Medicine

## 2017-02-23 ENCOUNTER — Encounter: Payer: Self-pay | Admitting: Sports Medicine

## 2017-03-01 ENCOUNTER — Encounter: Payer: Self-pay | Admitting: Sports Medicine

## 2017-03-02 ENCOUNTER — Encounter: Payer: Self-pay | Admitting: Sports Medicine

## 2017-03-06 ENCOUNTER — Other Ambulatory Visit: Payer: Self-pay | Admitting: Sports Medicine

## 2017-03-22 ENCOUNTER — Other Ambulatory Visit: Payer: Self-pay | Admitting: Sports Medicine

## 2017-04-03 ENCOUNTER — Other Ambulatory Visit: Payer: Self-pay | Admitting: Sports Medicine

## 2017-04-10 ENCOUNTER — Ambulatory Visit: Payer: BLUE CROSS/BLUE SHIELD | Admitting: Sports Medicine

## 2017-04-10 ENCOUNTER — Encounter: Payer: Self-pay | Admitting: Sports Medicine

## 2017-04-10 DIAGNOSIS — Z0000 Encounter for general adult medical examination without abnormal findings: Secondary | ICD-10-CM

## 2017-04-10 DIAGNOSIS — R112 Nausea with vomiting, unspecified: Secondary | ICD-10-CM | POA: Insufficient documentation

## 2017-04-10 DIAGNOSIS — R197 Diarrhea, unspecified: Secondary | ICD-10-CM

## 2017-04-10 DIAGNOSIS — I10 Essential (primary) hypertension: Secondary | ICD-10-CM

## 2017-04-10 DIAGNOSIS — E782 Mixed hyperlipidemia: Secondary | ICD-10-CM

## 2017-04-10 DIAGNOSIS — E119 Type 2 diabetes mellitus without complications: Secondary | ICD-10-CM

## 2017-04-10 MED ORDER — AMLODIPINE BESYLATE 10 MG PO TABS
10.0000 mg | ORAL_TABLET | Freq: Every day | ORAL | 3 refills | Status: DC
Start: 2017-04-10 — End: 2023-09-08

## 2017-04-10 MED ORDER — OMEPRAZOLE 20 MG PO CPDR: 20.0000 mg | DELAYED_RELEASE_CAPSULE | Freq: Every day | ORAL | 3 refills | Status: AC

## 2017-04-10 MED ORDER — GLIPIZIDE 10 MG PO TABS: ORAL_TABLET | ORAL | 3 refills | Status: DC

## 2017-04-10 MED ORDER — CARVEDILOL 25 MG PO TABS: 25.0000 mg | ORAL_TABLET | Freq: Two times a day (BID) | ORAL | 3 refills | Status: DC

## 2017-04-10 MED ORDER — HYDRALAZINE HCL 25 MG PO TABS: 25.0000 mg | ORAL_TABLET | Freq: Three times a day (TID) | ORAL | 3 refills | Status: DC

## 2017-04-10 MED ORDER — OMEGA-3-ACID ETHYL ESTERS 1 G PO CAPS: 2.0000 g | ORAL_CAPSULE | Freq: Two times a day (BID) | ORAL | 3 refills | Status: DC

## 2017-04-10 MED ORDER — FUROSEMIDE 40 MG PO TABS: 40.0000 mg | ORAL_TABLET | Freq: Every day | ORAL | 3 refills | Status: AC

## 2017-04-10 MED ORDER — ATORVASTATIN CALCIUM 20 MG PO TABS: 20.0000 mg | ORAL_TABLET | Freq: Every day | ORAL | 3 refills | Status: DC

## 2017-04-10 MED ORDER — VALSARTAN-HYDROCHLOROTHIAZIDE 320-25 MG PO TABS: 1.0000 | ORAL_TABLET | Freq: Every day | ORAL | 3 refills | Status: DC

## 2017-04-10 MED ORDER — FENOFIBRATE 160 MG PO TABS: 160.0000 mg | ORAL_TABLET | Freq: Every day | ORAL | 3 refills | Status: DC

## 2017-04-10 MED ORDER — CETIRIZINE HCL 10 MG PO TABS: 10.0000 mg | ORAL_TABLET | Freq: Every day | ORAL | 3 refills | Status: AC

## 2017-04-10 MED ORDER — METFORMIN HCL 1000 MG PO TABS: 1000.0000 mg | ORAL_TABLET | Freq: Two times a day (BID) | ORAL | 3 refills | Status: DC

## 2017-04-10 MED ORDER — DIPHENOXYLATE-ATROPINE 2.5-0.025 MG PO TABS: ORAL_TABLET | ORAL | 3 refills | Status: AC

## 2017-04-10 MED ORDER — ONDANSETRON 8 MG PO TBDP: 8.0000 mg | ORAL_TABLET | Freq: Three times a day (TID) | ORAL | 3 refills | Status: AC | PRN

## 2017-04-10 MED ORDER — POTASSIUM CHLORIDE CRYS ER 20 MEQ PO TBCR: EXTENDED_RELEASE_TABLET | ORAL | 3 refills | Status: DC

## 2017-04-10 NOTE — Assessment & Plan Note (Signed)
Food poisoning from coleslaw. Adding diphenoxylate/atropine and Zofran.

## 2017-04-10 NOTE — Assessment & Plan Note (Addendum)
Currently doing fenofibrate, Lovaza, atorvastatin. Not yet ready to do his lipid panel. Orders placed, he needs to get them done fasting ASAP.

## 2017-04-10 NOTE — Progress Notes (Signed)
  Subjective:    CC: Hyperlipidemia  HPI: Mr. Mercy RidingBurgess returns, he is doing well, he needs refills on all his medications, he has been doing fenofibrate, atorvastatin, Lovaza, he tells me he will get his lipids checked but he needs some time because his insurance is currently updating.  Food poisoning: Went out for dinner, had some coleslaw, steak, mashed potatoes, had immediate nausea, vomiting, diarrhea that is overall improving.  No hematochezia, hematemesis or melena.  Past medical history:  Negative.  See flowsheet/record as well for more information.  Surgical history: Negative.  See flowsheet/record as well for more information.  Family history: Negative.  See flowsheet/record as well for more information.  Social history: Negative.  See flowsheet/record as well for more information.  Allergies, and medications have been entered into the medical record, reviewed, and no changes needed.   Review of Systems: No fevers, chills, night sweats, weight loss, chest pain, or shortness of breath.   Objective:    General: Well Developed, well nourished, and in no acute distress.  Neuro: Alert and oriented x3, extra-ocular muscles intact, sensation grossly intact.  HEENT: Normocephalic, atraumatic, pupils equal round reactive to light, neck supple, no masses, no lymphadenopathy, thyroid nonpalpable.  Skin: Warm and dry, no rashes. Cardiac: Regular rate and rhythm, no murmurs rubs or gallops, no lower extremity edema.  Respiratory: Clear to auscultation bilaterally. Not using accessory muscles, speaking in full sentences. Abdomen: Soft, nontender, nondistended, normal bowel sounds, no palpable masses, no guarding, rigidity, rebound tenderness.  Impression and Recommendations:    Hyperlipidemia Currently doing fenofibrate, Lovaza, atorvastatin. Not yet ready to do his lipid panel. Orders placed, he needs to get them done fasting ASAP.  Nausea vomiting and diarrhea Food poisoning from  coleslaw. Adding diphenoxylate/atropine and Zofran.  I spent 25 minutes with this patient, greater than 50% was face-to-face time counseling regarding the above diagnoses ___________________________________________ Ihor Austinhomas J. Benjamin Stainhekkekandam, M.D., ABFM., CAQSM. Primary Care and Sports Medicine [facility]  Adjunct Instructor of Family Medicine  [employer]

## 2017-04-25 ENCOUNTER — Encounter: Payer: Self-pay | Admitting: Sports Medicine

## 2017-04-28 ENCOUNTER — Encounter: Payer: Self-pay | Admitting: Sports Medicine

## 2017-04-28 DIAGNOSIS — E119 Type 2 diabetes mellitus without complications: Secondary | ICD-10-CM

## 2017-04-28 MED ORDER — DULAGLUTIDE 1.5 MG/0.5ML ~~LOC~~ SOAJ: 1.5000 mg | SUBCUTANEOUS | 11 refills | Status: DC

## 2017-04-28 NOTE — Telephone Encounter (Signed)
Restarting Trulicity, return in 3 months for A1c.

## 2017-04-28 NOTE — Assessment & Plan Note (Signed)
Restarting Trulicity, return in 3 months for A1c. If still elevated we can restart Synjardy as well.

## 2017-05-04 ENCOUNTER — Other Ambulatory Visit: Payer: Self-pay

## 2017-05-04 MED ORDER — ACYCLOVIR 400 MG PO TABS: ORAL_TABLET | ORAL | 1 refills | Status: DC

## 2017-05-08 ENCOUNTER — Encounter: Payer: Self-pay | Admitting: Sports Medicine

## 2017-09-25 DIAGNOSIS — E1121 Type 2 diabetes mellitus with diabetic nephropathy: Secondary | ICD-10-CM | POA: Diagnosis not present

## 2017-09-25 DIAGNOSIS — M25551 Pain in right hip: Secondary | ICD-10-CM | POA: Diagnosis not present

## 2017-09-25 DIAGNOSIS — Z79899 Other long term (current) drug therapy: Secondary | ICD-10-CM | POA: Diagnosis not present

## 2017-09-25 DIAGNOSIS — N529 Male erectile dysfunction, unspecified: Secondary | ICD-10-CM | POA: Diagnosis not present

## 2017-09-25 DIAGNOSIS — F320 Major depressive disorder, single episode, mild: Secondary | ICD-10-CM | POA: Diagnosis not present

## 2017-12-07 ENCOUNTER — Other Ambulatory Visit: Payer: Self-pay | Admitting: Sports Medicine

## 2018-03-12 DIAGNOSIS — J4520 Mild intermittent asthma, uncomplicated: Secondary | ICD-10-CM | POA: Diagnosis not present

## 2018-04-04 ENCOUNTER — Other Ambulatory Visit: Payer: Self-pay | Admitting: Sports Medicine

## 2018-04-04 DIAGNOSIS — I10 Essential (primary) hypertension: Secondary | ICD-10-CM

## 2018-04-08 ENCOUNTER — Other Ambulatory Visit: Payer: Self-pay | Admitting: Sports Medicine

## 2018-04-08 DIAGNOSIS — E782 Mixed hyperlipidemia: Secondary | ICD-10-CM

## 2018-04-19 ENCOUNTER — Other Ambulatory Visit: Payer: Self-pay | Admitting: Sports Medicine

## 2018-04-19 DIAGNOSIS — E119 Type 2 diabetes mellitus without complications: Secondary | ICD-10-CM

## 2018-04-19 MED ORDER — METFORMIN HCL 1000 MG PO TABS: 1000.0000 mg | ORAL_TABLET | Freq: Two times a day (BID) | ORAL | 3 refills | Status: AC

## 2018-04-20 ENCOUNTER — Other Ambulatory Visit: Payer: Self-pay | Admitting: Sports Medicine

## 2018-05-04 ENCOUNTER — Other Ambulatory Visit: Payer: Self-pay | Admitting: Sports Medicine

## 2018-05-04 DIAGNOSIS — E119 Type 2 diabetes mellitus without complications: Secondary | ICD-10-CM

## 2018-05-05 ENCOUNTER — Other Ambulatory Visit: Payer: Self-pay | Admitting: Sports Medicine

## 2018-05-05 DIAGNOSIS — E782 Mixed hyperlipidemia: Secondary | ICD-10-CM

## 2018-05-06 ENCOUNTER — Other Ambulatory Visit: Payer: Self-pay | Admitting: Sports Medicine

## 2018-05-06 DIAGNOSIS — I10 Essential (primary) hypertension: Secondary | ICD-10-CM

## 2018-05-08 ENCOUNTER — Other Ambulatory Visit: Payer: Self-pay | Admitting: Sports Medicine

## 2018-05-08 DIAGNOSIS — I10 Essential (primary) hypertension: Secondary | ICD-10-CM

## 2018-06-05 ENCOUNTER — Other Ambulatory Visit: Payer: Self-pay | Admitting: Sports Medicine

## 2018-06-12 ENCOUNTER — Other Ambulatory Visit: Payer: Self-pay | Admitting: Sports Medicine

## 2018-06-12 MED ORDER — ACYCLOVIR 400 MG PO TABS: ORAL_TABLET | ORAL | 1 refills | Status: DC

## 2019-08-01 ENCOUNTER — Other Ambulatory Visit: Payer: Self-pay | Admitting: Sports Medicine

## 2019-08-01 DIAGNOSIS — E782 Mixed hyperlipidemia: Secondary | ICD-10-CM

## 2019-08-01 DIAGNOSIS — I10 Essential (primary) hypertension: Secondary | ICD-10-CM

## 2019-08-01 DIAGNOSIS — E119 Type 2 diabetes mellitus without complications: Secondary | ICD-10-CM

## 2019-08-02 NOTE — Telephone Encounter (Signed)
This patient has established care with another family doctor at [facility], he should request refills from them.

## 2022-08-05 ENCOUNTER — Ambulatory Visit
Admission: EM | Admit: 2022-08-05 | Discharge: 2022-08-05 | Disposition: A | Discharge status: Home / Self Care | Payer: Medicare Other | Attending: Family Medicine | Admitting: Family Medicine

## 2022-08-05 ENCOUNTER — Encounter: Payer: Self-pay | Admitting: Emergency Medicine

## 2022-08-05 DIAGNOSIS — N184 Chronic kidney disease, stage 4 (severe): Secondary | ICD-10-CM

## 2022-08-05 DIAGNOSIS — R0602 Shortness of breath: Secondary | ICD-10-CM

## 2022-08-05 NOTE — ED Triage Notes (Signed)
SOB  the last 2 days  Has an albuterol inhaler - no relief Pt states he feels like he is wheezing  Here w/ his wife Denies chills or fever

## 2022-08-05 NOTE — Discharge Instructions (Signed)
Take next dose of clonidine this evening, and monitor BP.  Call your nephrologist tomorrow morning if not improving. Weigh yourself several times weekly and record.  If symptoms become significantly worse during the night or over the weekend, proceed to the local emergency room.

## 2022-08-05 NOTE — ED Provider Notes (Signed)
[name]    CSN: [id number] Arrival date & time: 08/05/22  1815      History   Chief Complaint Chief Complaint  Patient presents with   Shortness of Breath    HPI Antonio Burns is a 67 y.o. male.   Patient complains of mild shortness of breath with activity during the past two days, and a sensation of wheezing not relieved with his albuterol inhaler.  He has minimal cough and denies fevers/chills/sweats, chest pain, and paroxysmal nocturnal dyspnea.  He has dependent edema but does not feel that he has had increased swelling in his legs.  He complains of fatigue but no recent change.  He admits that he forgot to take one of his antihypertensive meds (clonidine) this morning. Review of records reveals that patient has multiple medical problems.  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  His creatinine was 5.01 on 06/30/22 but now stable at 4.73 on 07/14/22.  His nephrologist has referred him for placement of a laparascopic peritoneal dialysis catheter in the near future.  He has anemia of chronic disease.  His Hgb was 9.3 on 05/30/22, but remains stable at 9.3 on 07/14/22.  He has atrial fib that was converted to sinus rhythm by amiodarone, now switched to dronedarone. A chest x-ray done 06/30/22 revealed cardiomegaly and mild CHF.  BNP at that time was elevated at 213.6       Past Medical History:  Diagnosis Date   Diabetes mellitus without complication ([place])    Hyperlipidemia    Hypertension    Sleep apnea     Patient Active Problem List   Diagnosis Date Noted   Nausea vomiting and diarrhea 04/10/2017   Left wrist pain 09/05/2016   Idiopathic thrombocytopenic purpura ([place]) 08/18/2014   Lumbar degenerative disc disease 05/12/2014   Obstructive uropathy 05/12/2014   Genital herpes 05/12/2014   Erectile dysfunction 03/19/2014   Annual physical exam 03/19/2014   Hyperlipidemia     Hypertension    Diabetes mellitus without complication ([place])    Sleep apnea     History reviewed. No pertinent surgical history.     Home Medications    Prior to Admission medications   Medication Sig Start Date End Date Taking? Authorizing Provider  albuterol (VENTOLIN HFA) 108 (90 Base) MCG/ACT inhaler INHALE 2 PUFFS BY MOUTH EVERY 4 HOURS AS NEEDED FOR WHEEZING 08/01/22  Yes [provider]  aspirin EC 81 MG tablet Take 1 tablet by mouth daily. 05/18/21  Yes [provider]  Blood Glucose Monitoring Suppl ([place]) w/Device KIT  02/17/20  Yes [provider]  Blood Glucose Monitoring Suppl (ONETOUCH VERIO) w/Device KIT Use to check blood sugar as directed. 03/10/22  Yes [provider]  cloNIDine (CATAPRES) 0.1 MG tablet Take by mouth. 05/16/22  Yes [provider]  clopidogrel (PLAVIX) 75 MG tablet Take 1 tablet by mouth daily. 05/21/22  Yes [provider]  dronedarone (MULTAQ) 400 MG tablet Take by mouth. 07/04/22  Yes [provider]  [place]. Devices MISC Start CPAP at 11 cm. water pressure.  CPAP machine with a mask and supplies for OSA.  Mask fitting or patient preference.  Send to a local DME. 04/07/21  Yes [provider]  nitroGLYCERIN (NITROSTAT) 0.4 MG SL tablet Place under the tongue. 10/24/21  Yes [provider]  tadalafil (CIALIS) 20 MG tablet Take by mouth. 10/24/21  Yes [provider]  acyclovir (ZOVIRAX) 400 MG tablet TAKE 1 TABLET BY MOUTH TWICE A DAY Patient not taking: Reported on 08/05/2022 06/12/18   Silverio Decamp, MD  amLODipine (NORVASC) 10 MG tablet Take 1 tablet (10 mg total) by mouth daily. Patient not taking: Reported on 08/05/2022 04/10/17   Silverio Decamp, MD  atorvastatin (LIPITOR) 20 MG tablet TAKE 1 TABLET BY MOUTH EVERY DAY 05/07/18   Silverio Decamp, MD  carvedilol (COREG) 25 MG tablet TAKE 1 TABLET (25 MG TOTAL) BY MOUTH 2 (TWO)  TIMES DAILY WITH A MEAL. 04/04/18   Silverio Decamp, MD  celecoxib (CELEBREX) 200 MG capsule 1-2 CAPSULES BY MOUTH DAILY AS NEEDED FOR PAIN. Patient not taking: Reported on 08/05/2022 10/10/16   Silverio Decamp, MD  cetirizine (ZYRTEC) 10 MG tablet Take 1 tablet (10 mg total) by mouth daily. 04/10/17   Silverio Decamp, MD  clotrimazole-betamethasone (LOTRISONE) cream Apply 1 application topically 2 (two) times daily. Patient not taking: Reported on 08/05/2022 06/23/16   Silverio Decamp, MD  cyclobenzaprine (FLEXERIL) 10 MG tablet Take 1 tablet (10 mg total) by mouth 2 (two) times daily. Patient not taking: Reported on 08/05/2022 07/23/15   Silverio Decamp, MD  Diclofenac Sodium (PENNSAID) 2 % SOLN Apply twice a day Patient not taking: Reported on 08/05/2022 11/02/15   Silverio Decamp, MD  diphenoxylate-atropine (LOMOTIL) 2.5-0.025 MG tablet One to 2 tablets by mouth 4 times a day as needed for diarrhea. Patient not taking: Reported on 08/05/2022 04/10/17   Silverio Decamp, MD  Dulaglutide 1.5 MG/0.5ML SOPN INJECT 1.5 MG INTO THE SKIN ONCE A WEEK. 05/04/18   Silverio Decamp, MD  fenofibrate 160 MG tablet TAKE 1 TABLET BY MOUTH EVERY DAY Patient not taking: Reported on 08/05/2022 05/07/18   Silverio Decamp, MD  furosemide (LASIX) 40 MG tablet Take 1 tablet (40 mg total) by mouth daily. 04/10/17   Silverio Decamp, MD  glipiZIDE (GLUCOTROL) 10 MG tablet TAKE 1 TABLET (10 MG TOTAL) BY MOUTH 2 (TWO) TIMES DAILY BEFORE A MEAL. Patient not taking: Reported on 08/05/2022 04/19/18   Silverio Decamp, MD  hydrALAZINE (APRESOLINE) 25 MG tablet TAKE 1 TABLET BY MOUTH THREE TIMES A DAY 04/20/18   Silverio Decamp, MD  metFORMIN (GLUCOPHAGE) 1000 MG tablet Take 1 tablet (1,000 mg total) by mouth 2 (two) times daily with a meal. Patient not taking: Reported on 08/05/2022 04/19/18   Silverio Decamp, MD  omega-3 acid ethyl esters (LOVAZA) 1  g capsule TAKE 2 CAPSULES (2 G TOTAL) BY MOUTH 2 (TWO) TIMES DAILY. Patient not taking: Reported on 08/05/2022 04/08/18   Silverio Decamp, MD  omeprazole (PRILOSEC) 20 MG capsule Take 1 capsule (20 mg total) by mouth daily. 04/10/17   Silverio Decamp, MD  ondansetron (ZOFRAN-ODT) 8 MG disintegrating tablet Take 1 tablet (8 mg total) by mouth every 8 (eight) hours as needed for nausea. Patient not taking: Reported on 08/05/2022 04/10/17   Silverio Decamp, MD  potassium chloride SA (KLOR-CON M20) 20 MEQ tablet TAKE 2 TABLETS (40 MEQ TOTAL) BY MOUTH 2 (TWO) TIMES DAILY. 06/13/18   Silverio Decamp, MD  sildenafil (REVATIO) 20 MG tablet Take 1 tablet (20 mg total) by mouth as needed. Patient not taking: Reported on 08/05/2022 11/23/16   Silverio Decamp, MD  sodium bicarbonate 650 MG tablet Take 650 mg by mouth 2 (two) times daily.    [provider]  TRESIBA FLEXTOUCH 100 UNIT/ML FlexTouch Pen SMARTSIG:8 Unit(s) SUB-Q Daily    [provider]  valsartan-hydrochlorothiazide (DIOVAN-HCT) 320-25 MG tablet TAKE 1 TABLET BY MOUTH EVERY DAY Patient not taking: Reported on 08/05/2022 05/08/18   Silverio Decamp, MD    Family History Family History  Problem Relation Age of Onset   Hypertension Mother    Cancer Mother        breast   Hypertension Father     Social History Social History   Tobacco Use   Smoking status: Former    Packs/day: 1.00    Years: 25.00    Additional pack years: 0.00    Total pack years: 25.00    Types: Cigarettes    Quit date: 07/25/1990    Years since quitting: 32.0   Smokeless tobacco: Never  Vaping Use   Vaping Use: Never used  Substance Use Topics   Alcohol use: No   Drug use: No     Allergies   Erythromycin and Sulfa antibiotics   Review of Systems Review of Systems  Constitutional:  Positive for fatigue. Negative for activity change, appetite change, chills, diaphoresis, fever and unexpected weight  change.  HENT: Negative.    Eyes: Negative.   Respiratory:  Positive for shortness of breath. Negative for cough, choking, chest tightness, wheezing and stridor.   Cardiovascular:  Positive for leg swelling. Negative for chest pain and palpitations.  Gastrointestinal: Negative.   Endocrine: Negative.   Genitourinary: Negative.   Musculoskeletal: Negative.   Skin: Negative.   Neurological:  Negative for syncope, light-headedness and headaches.  Hematological:  Negative for adenopathy.     Physical Exam Triage Vital Signs ED Triage Vitals  Enc Vitals Group     BP 08/05/22 1836 (!) 211/87     Pulse Rate 08/05/22 1836 65     Resp 08/05/22 1836 18     Temp 08/05/22 1836 97.6 F (36.4 C)     Temp Source 08/05/22 1836 Oral     SpO2 08/05/22 1836 97 %     Weight 08/05/22 1840 220 lb (99.8 kg)     Height 08/05/22 1840 5\' 11"  (1.803 m)     Head Circumference --      Peak Flow --      Pain Score 08/05/22 1839 0     Pain Loc --      Pain Edu? --      Excl. in [place]? --    No data found.  Updated Vital Signs BP (!) 210/86 (BP Location: Right Arm) Comment: pt states he may have forgotten to take his clonidine  Pulse 65   Temp 97.6 F (36.4 C) (Oral)   Resp 18   Ht 5\' 11"  (1.803 m)   Wt 99.8 kg   SpO2 97%   BMI 30.68 kg/m   Visual Acuity Right Eye Distance:   Left Eye Distance:   Bilateral Distance:    Right Eye Near:   Left Eye Near:    Bilateral Near:     Physical Exam Vitals and nursing note reviewed.  Constitutional:      General: He is not in acute distress.    Appearance: He is not ill-appearing, toxic-appearing or diaphoretic.  HENT:     Head: Normocephalic.     Mouth/Throat:     Mouth: Mucous membranes are moist.     Pharynx: Oropharynx is clear.  Eyes:     Extraocular Movements: Extraocular movements intact.     Conjunctiva/sclera: Conjunctivae normal.  Pupils: Pupils are equal, round, and reactive to light.  Cardiovascular:     Rate and Rhythm:  Normal rate and regular rhythm.     Heart sounds: Normal heart sounds.  Pulmonary:     Effort: No respiratory distress.     Breath sounds: Normal breath sounds. No wheezing, rhonchi or rales.  Abdominal:     General: There is no distension.     Palpations: Abdomen is soft.     Tenderness: There is no abdominal tenderness.  Musculoskeletal:     Cervical back: Neck supple.     Right lower leg: Edema present.     Left lower leg: Edema present.     Comments: Bilateral lower leg 3+ pitting edema  Lymphadenopathy:     Cervical: No cervical adenopathy.  Skin:    General: Skin is warm and dry.     Findings: No rash.  Neurological:     Mental Status: He is alert and oriented to person, place, and time.      UC Treatments / Results  Labs (all labs ordered are listed, but only abnormal results are displayed) Labs Reviewed - No data to display  EKG   Radiology No results found.  Procedures Procedures (including critical care time)  Medications Ordered in UC Medications - No data to display  Initial Impression / Assessment and Plan / UC Course  I have reviewed the triage vital signs and the nursing notes.  Pertinent labs & imaging results that were available during my care of the patient were reviewed by me and considered in my medical decision making (see chart for details).    Patient has relatively benign exam this evening (except elevated systolic BP) and is in no acute distress.   Advised to take his second daily dose of clonidine as soon as possible this evening and monitor BP.  Advised to contact his nephrologist tomorrow morning if BP remains elevated. Suspect that patient's mild dyspnea (with normal SpO2 97%) may represent mild exacerbation of CHF.  Review of records reveals a weight of 100.7kg on 06/17/22, 97.1kg on 07/14/22, and an increase to 99.8kg today.  Will defer any intervention at this time and recommend that patient follow-up with his cardiologist as soon as  possible for further evaluation.  Final Clinical Impressions(s) / UC Diagnoses   Final diagnoses:  Shortness of breath  Chronic kidney disease (CKD), stage IV (severe) ([facility]CC)     Discharge Instructions      Take next dose of clonidine this evening, and monitor BP.  Call your nephrologist tomorrow morning if not improving. Weigh yourself several times weekly and record.  If symptoms become significantly worse during the night or over the weekend, proceed to the local emergency room.     ED Prescriptions   None       Kandra Nicolas, MD 08/06/22 2002

## 2022-08-06 ENCOUNTER — Telehealth: Payer: Self-pay | Admitting: Emergency Medicine

## 2022-08-06 NOTE — Telephone Encounter (Signed)
Call to Antonio Burns to see how he was today & to see if his BP had come down. Pt stated his SBP was 200 tis morning when he woke up. Pt stated he had not called his nephrologist this am as directed by Dr Assunta Found. Pt stated his BP should come down, he started on isosorbide today. Pt verbalized an understanding that he should go to the ED and call his nephrologist if his BP remains elevated. Pt denies chest pain. No other questions or concerns at this time

## 2023-09-08 ENCOUNTER — Ambulatory Visit: Admission: EM | Admit: 2023-09-08 | Discharge: 2023-09-08 | Disposition: A | Discharge status: Home / Self Care

## 2023-09-08 DIAGNOSIS — T161XXA Foreign body in right ear, initial encounter: Secondary | ICD-10-CM

## 2023-09-08 NOTE — ED Triage Notes (Signed)
 Pt presents to uc with co fb in right ear. Pt reports about 2 hr ago he got part of his hearing aid stuck in his right ear. Wife attempted to remove it however was unsuccessful and was worried she would push it in more. Denies any pain

## 2023-09-08 NOTE — ED Provider Notes (Signed)
 [facility]    CSN: [id number] Arrival date & time: 09/08/23  1241      History   Chief Complaint Chief Complaint  Patient presents with   Foreign Body in Ear    HPI Antonio Burns is a 68 y.o. male.   HPI Pleasant 68 year old male presents with foreign body of right ear.  PMH significant for morbid obesity, T2DM without complication, and HTN.  Past Medical History:  Diagnosis Date   Diabetes mellitus without complication ([facility])    Hyperlipidemia    Hypertension    Sleep apnea     Patient Active Problem List   Diagnosis Date Noted   Nausea vomiting and diarrhea 04/10/2017   Left wrist pain 09/05/2016   Idiopathic thrombocytopenic purpura ([facility]) 08/18/2014   Lumbar degenerative disc disease 05/12/2014   Obstructive uropathy 05/12/2014   Genital herpes 05/12/2014   Erectile dysfunction 03/19/2014   Annual physical exam 03/19/2014   Hyperlipidemia    Hypertension    Diabetes mellitus without complication ([facility])    Sleep apnea     History reviewed. No pertinent surgical history.     Home Medications    Prior to Admission medications   Medication Sig Start Date End Date Taking? Authorizing Provider  albuterol  (VENTOLIN  HFA) 108 (90 Base) MCG/ACT inhaler INHALE 2 PUFFS BY MOUTH EVERY 4 HOURS AS NEEDED FOR WHEEZING 08/01/22   [provider]  aspirin EC 81 MG tablet Take 1 tablet by mouth daily. 05/18/21   [provider]  atorvastatin  (LIPITOR) 20 MG tablet TAKE 1 TABLET BY MOUTH EVERY DAY 05/07/18   Gean Keels, MD  Blood Glucose Monitoring Suppl (ONETOUCH VERIO FLEX SYSTEM) w/Device KIT  02/17/20   [provider]  Blood Glucose Monitoring Suppl (ONETOUCH VERIO) w/Device KIT Use to check blood sugar as directed. 03/10/22   [provider]  carvedilol  (COREG ) 25 MG tablet TAKE 1 TABLET (25 MG TOTAL) BY MOUTH 2 (TWO) TIMES DAILY WITH A MEAL. 04/04/18   Gean Keels, MD  cetirizine  (ZYRTEC ) 10 MG  tablet Take 1 tablet (10 mg total) by mouth daily. 04/10/17   Gean Keels, MD  cloNIDine (CATAPRES) 0.1 MG tablet Take by mouth. 05/16/22   [provider]  clopidogrel (PLAVIX) 75 MG tablet Take 1 tablet by mouth daily. 05/21/22   [provider]  clotrimazole -betamethasone  (LOTRISONE ) cream Apply 1 application topically 2 (two) times daily. Patient not taking: Reported on 08/05/2022 06/23/16   Gean Keels, MD  cyclobenzaprine  (FLEXERIL ) 10 MG tablet Take 1 tablet (10 mg total) by mouth 2 (two) times daily. Patient not taking: Reported on 08/05/2022 07/23/15   Gean Keels, MD  Diclofenac  Sodium (PENNSAID ) 2 % SOLN Apply twice a day Patient not taking: Reported on 08/05/2022 11/02/15   Gean Keels, MD  diphenoxylate -atropine  (LOMOTIL ) 2.5-0.025 MG tablet One to 2 tablets by mouth 4 times a day as needed for diarrhea. Patient not taking: Reported on 08/05/2022 04/10/17   Gean Keels, MD  dronedarone (MULTAQ) 400 MG tablet Take by mouth. 07/04/22   [provider]  Dulaglutide  1.5 MG/0.5ML SOPN INJECT 1.5 MG INTO THE SKIN ONCE A WEEK. 05/04/18   Gean Keels, MD  fenofibrate  160 MG tablet TAKE 1 TABLET BY MOUTH EVERY DAY Patient not taking: Reported on 08/05/2022 05/07/18   Gean Keels, MD  furosemide  (LASIX ) 40 MG tablet Take 1 tablet (40 mg total) by mouth daily. 04/10/17   Gean Keels, MD  glipiZIDE  (GLUCOTROL ) 10 MG tablet TAKE 1 TABLET (10 MG TOTAL) BY MOUTH 2 (TWO) TIMES DAILY BEFORE A MEAL. Patient not taking: Reported on 08/05/2022 04/19/18   Gean Keels, MD  hydrALAZINE  (APRESOLINE ) 25 MG tablet TAKE 1 TABLET BY MOUTH THREE TIMES A DAY 04/20/18   Gean Keels, MD  metFORMIN  (GLUCOPHAGE ) 1000 MG tablet Take 1 tablet (1,000 mg total) by mouth 2 (two) times daily with a meal. Patient not taking: Reported on 08/05/2022 04/19/18   Gean Keels, MD  Misc. Devices MISC  Start CPAP at 11 cm. water pressure.  CPAP machine with a mask and supplies for OSA.  Mask fitting or patient preference.  Send to a local DME. 04/07/21   [provider]  nitroGLYCERIN (NITROSTAT) 0.4 MG SL tablet Place under the tongue. 10/24/21   [provider]  omega-3 acid ethyl esters (LOVAZA ) 1 g capsule TAKE 2 CAPSULES (2 G TOTAL) BY MOUTH 2 (TWO) TIMES DAILY. Patient not taking: Reported on 08/05/2022 04/08/18   Gean Keels, MD  omeprazole  (PRILOSEC) 20 MG capsule Take 1 capsule (20 mg total) by mouth daily. 04/10/17   Gean Keels, MD  ondansetron  (ZOFRAN -ODT) 8 MG disintegrating tablet Take 1 tablet (8 mg total) by mouth every 8 (eight) hours as needed for nausea. Patient not taking: Reported on 08/05/2022 04/10/17   Gean Keels, MD  potassium chloride  SA (KLOR-CON  M20) 20 MEQ tablet TAKE 2 TABLETS (40 MEQ TOTAL) BY MOUTH 2 (TWO) TIMES DAILY. 06/13/18   Gean Keels, MD  sildenafil  (REVATIO ) 20 MG tablet Take 1 tablet (20 mg total) by mouth as needed. Patient not taking: Reported on 08/05/2022 11/23/16   Gean Keels, MD  sodium bicarbonate 650 MG tablet Take 650 mg by mouth 2 (two) times daily.    [provider]  tadalafil (CIALIS) 20 MG tablet Take by mouth. 10/24/21   [provider]  TRESIBA FLEXTOUCH 100 UNIT/ML FlexTouch Pen SMARTSIG:8 Unit(s) SUB-Q Daily    [provider]  valsartan -hydrochlorothiazide  (DIOVAN -HCT) 320-25 MG tablet TAKE 1 TABLET BY MOUTH EVERY DAY Patient not taking: Reported on 08/05/2022 05/08/18   Gean Keels, MD    Family History Family History  Problem Relation Age of Onset   Hypertension Mother    Cancer Mother        breast   Hypertension Father     Social History Social History   Tobacco Use   Smoking status: Former    Current packs/day: 0.00    Average packs/day: 1 pack/day for 25.0 years (25.0 ttl pk-yrs)    Types: Cigarettes    Start date:  07/24/1965    Quit date: 07/25/1990    Years since quitting: 33.1   Smokeless tobacco: Never  Vaping Use   Vaping status: Never Used  Substance Use Topics   Alcohol use: No   Drug use: No     Allergies   Erythromycin and Sulfa antibiotics   Review of Systems Review of Systems   Physical Exam Triage Vital Signs ED Triage Vitals  Encounter Vitals Group     BP      Systolic BP Percentile      Diastolic BP Percentile      Pulse      Resp      Temp      Temp src      SpO2      Weight      Height      Head  Circumference      Peak Flow      Pain Score      Pain Loc      Pain Education      Exclude from Growth Chart    No data found.  Updated Vital Signs BP 100/61   Pulse (!) 101   Temp 98.6 F (37 C)   Resp 16   SpO2 98%    Physical Exam Vitals and nursing note reviewed.  Constitutional:      Appearance: Normal appearance. He is obese.  HENT:     Head: Normocephalic and atraumatic.     Right Ear: Tympanic membrane and external ear normal.     Left Ear: Tympanic membrane, ear canal and external ear normal.     Ears:     Comments: EAC: Small(<0.5 cm) clear silicone hearing aid tip noted: Clear silicone tip from hearing aid removed successfully from right ear canal.  Patient tolerated procedure well.     Nose: Nose normal.     Mouth/Throat:     Mouth: Mucous membranes are moist.     Pharynx: Oropharynx is clear.  Eyes:     Extraocular Movements: Extraocular movements intact.     Conjunctiva/sclera: Conjunctivae normal.     Pupils: Pupils are equal, round, and reactive to light.  Cardiovascular:     Rate and Rhythm: Normal rate and regular rhythm.     Pulses: Normal pulses.     Heart sounds: Normal heart sounds.  Pulmonary:     Effort: Pulmonary effort is normal.     Breath sounds: Normal breath sounds. No wheezing, rhonchi or rales.  Chest:     Chest wall: No tenderness.  Musculoskeletal:        General: Normal range of motion.     Cervical back:  Normal range of motion and neck supple.  Skin:    General: Skin is warm and dry.  Neurological:     General: No focal deficit present.     Mental Status: He is alert. Mental status is at baseline. He is disoriented.  Psychiatric:        Mood and Affect: Mood normal.        Behavior: Behavior normal.      UC Treatments / Results  Labs (all labs ordered are listed, but only abnormal results are displayed) Labs Reviewed - No data to display  EKG   Radiology No results found.  Procedures Procedures (including critical care time)  Medications Ordered in UC Medications - No data to display  Initial Impression / Assessment and Plan / UC Course  I have reviewed the triage vital signs and the nursing notes.  Pertinent labs & imaging results that were available during my care of the patient were reviewed by me and considered in my medical decision making (see chart for details).     MDM: 1.  Foreign body of right ear, initial encounter-clear silicone tip from hearing aid removed successfully from right ear canal.  Patient tolerated procedure well. Clear silicone tip from hearing aid removed successfully from right ear canal.  Advised patient if symptoms worsen and/or unresolved please follow-up with your PCP or here for further evaluation.  Patient discharged home, hemodynamically stable. Final Clinical Impressions(s) / UC Diagnoses   Final diagnoses:  Foreign body of right ear, initial encounter     Discharge Instructions      Clear silicone tip from hearing aid removed successfully from right ear canal.  Advised patient if symptoms  worsen and/or unresolved please follow-up with your PCP or here for further evaluation.     ED Prescriptions   None    PDMP not reviewed this encounter.   Leonides Ramp, FNP 09/08/23 1428

## 2023-09-08 NOTE — Discharge Instructions (Addendum)
 Clear silicone tip from hearing aid removed successfully from right ear canal.  Advised patient if symptoms worsen and/or unresolved please follow-up with your PCP or here for further evaluation.
